# Patient Record
Sex: Female | Born: 1960 | Race: White | Hispanic: No | Marital: Single | Smoking: Never smoker
Health system: Southern US, Community
[De-identification: ages and names within clinical notes are randomized; demographics above are authoritative.]

## PROBLEM LIST (undated history)

## (undated) DIAGNOSIS — D649 Anemia, unspecified: Secondary | ICD-10-CM

## (undated) DIAGNOSIS — T7840XA Allergy, unspecified, initial encounter: Secondary | ICD-10-CM

## (undated) HISTORY — PX: WISDOM TOOTH EXTRACTION: SHX21

## (undated) HISTORY — PX: COLONOSCOPY: SHX174

## (undated) HISTORY — PX: APPENDECTOMY: SHX54

## (undated) HISTORY — DX: Allergy, unspecified, initial encounter: T78.40XA

## (undated) HISTORY — DX: Anemia, unspecified: D64.9

---

## 2002-03-12 ENCOUNTER — Other Ambulatory Visit: Admission: RE | Admit: 2002-03-12 | Discharge: 2002-03-12 | Payer: Self-pay | Admitting: Obstetrics and Gynecology

## 2003-04-20 ENCOUNTER — Other Ambulatory Visit: Admission: RE | Admit: 2003-04-20 | Discharge: 2003-04-20 | Payer: Self-pay | Admitting: Obstetrics and Gynecology

## 2003-05-03 ENCOUNTER — Ambulatory Visit (HOSPITAL_COMMUNITY): Admission: RE | Admit: 2003-05-03 | Discharge: 2003-05-03 | Payer: Self-pay | Admitting: Obstetrics and Gynecology

## 2003-05-23 ENCOUNTER — Encounter (HOSPITAL_COMMUNITY): Admission: RE | Admit: 2003-05-23 | Discharge: 2003-08-21 | Payer: Self-pay | Admitting: Obstetrics and Gynecology

## 2004-04-27 ENCOUNTER — Other Ambulatory Visit: Admission: RE | Admit: 2004-04-27 | Discharge: 2004-04-27 | Payer: Self-pay | Admitting: Obstetrics and Gynecology

## 2004-05-28 ENCOUNTER — Ambulatory Visit: Payer: Self-pay | Admitting: Internal Medicine

## 2004-05-30 ENCOUNTER — Ambulatory Visit (HOSPITAL_COMMUNITY): Admission: RE | Admit: 2004-05-30 | Discharge: 2004-05-30 | Payer: Self-pay | Admitting: Obstetrics and Gynecology

## 2004-06-08 ENCOUNTER — Ambulatory Visit: Payer: Self-pay | Admitting: Internal Medicine

## 2004-06-08 ENCOUNTER — Ambulatory Visit (HOSPITAL_COMMUNITY): Admission: RE | Admit: 2004-06-08 | Discharge: 2004-06-08 | Payer: Self-pay | Admitting: Internal Medicine

## 2004-08-22 ENCOUNTER — Ambulatory Visit: Payer: Self-pay | Admitting: Internal Medicine

## 2004-08-28 ENCOUNTER — Ambulatory Visit: Payer: Self-pay | Admitting: Internal Medicine

## 2005-05-21 ENCOUNTER — Other Ambulatory Visit: Admission: RE | Admit: 2005-05-21 | Discharge: 2005-05-21 | Payer: Self-pay | Admitting: Obstetrics and Gynecology

## 2005-07-31 ENCOUNTER — Ambulatory Visit (HOSPITAL_COMMUNITY): Admission: RE | Admit: 2005-07-31 | Discharge: 2005-07-31 | Payer: Self-pay | Admitting: Obstetrics and Gynecology

## 2005-08-29 ENCOUNTER — Encounter: Admission: RE | Admit: 2005-08-29 | Discharge: 2005-08-29 | Payer: Self-pay | Admitting: Obstetrics and Gynecology

## 2005-09-11 ENCOUNTER — Encounter: Admission: RE | Admit: 2005-09-11 | Discharge: 2005-09-11 | Payer: Self-pay | Admitting: Obstetrics and Gynecology

## 2006-03-21 ENCOUNTER — Ambulatory Visit: Payer: Self-pay | Admitting: Internal Medicine

## 2006-09-16 ENCOUNTER — Ambulatory Visit (HOSPITAL_COMMUNITY): Admission: RE | Admit: 2006-09-16 | Discharge: 2006-09-16 | Payer: Self-pay | Admitting: Obstetrics and Gynecology

## 2006-09-18 ENCOUNTER — Encounter: Admission: RE | Admit: 2006-09-18 | Discharge: 2006-09-18 | Payer: Self-pay | Admitting: Obstetrics and Gynecology

## 2007-06-18 ENCOUNTER — Ambulatory Visit: Payer: Self-pay | Admitting: Internal Medicine

## 2007-06-18 DIAGNOSIS — G43009 Migraine without aura, not intractable, without status migrainosus: Secondary | ICD-10-CM | POA: Insufficient documentation

## 2007-06-18 DIAGNOSIS — E876 Hypokalemia: Secondary | ICD-10-CM | POA: Insufficient documentation

## 2007-09-28 ENCOUNTER — Ambulatory Visit (HOSPITAL_COMMUNITY): Admission: RE | Admit: 2007-09-28 | Discharge: 2007-09-28 | Payer: Self-pay | Admitting: Obstetrics and Gynecology

## 2007-10-22 ENCOUNTER — Ambulatory Visit: Payer: Self-pay | Admitting: Internal Medicine

## 2007-10-27 ENCOUNTER — Encounter: Payer: Self-pay | Admitting: Internal Medicine

## 2007-10-29 ENCOUNTER — Encounter (INDEPENDENT_AMBULATORY_CARE_PROVIDER_SITE_OTHER): Payer: Self-pay | Admitting: *Deleted

## 2008-03-07 ENCOUNTER — Encounter (INDEPENDENT_AMBULATORY_CARE_PROVIDER_SITE_OTHER): Payer: Self-pay | Admitting: *Deleted

## 2008-10-03 ENCOUNTER — Ambulatory Visit (HOSPITAL_COMMUNITY): Admission: RE | Admit: 2008-10-03 | Discharge: 2008-10-03 | Payer: Self-pay | Admitting: Obstetrics and Gynecology

## 2009-01-25 ENCOUNTER — Ambulatory Visit: Payer: Self-pay | Admitting: Internal Medicine

## 2009-07-31 ENCOUNTER — Ambulatory Visit: Payer: Self-pay | Admitting: Internal Medicine

## 2009-07-31 DIAGNOSIS — J309 Allergic rhinitis, unspecified: Secondary | ICD-10-CM | POA: Insufficient documentation

## 2009-09-08 DIAGNOSIS — D649 Anemia, unspecified: Secondary | ICD-10-CM

## 2009-09-08 HISTORY — DX: Anemia, unspecified: D64.9

## 2009-10-02 IMAGING — MG MM DIGITAL SCREENING BILAT
5 series · 5 of 5 positions shown · non-contrast
Comparison: none

DG SCREEN MAMMOGRAM BILATERAL
Bilateral CC and MLO view(s) were taken.

DIGITAL SCREENING MAMMOGRAM WITH CAD:
The breast tissue is extremely dense.  No masses or malignant type calcifications are identified.  
Compared with prior studies.

[R CC]
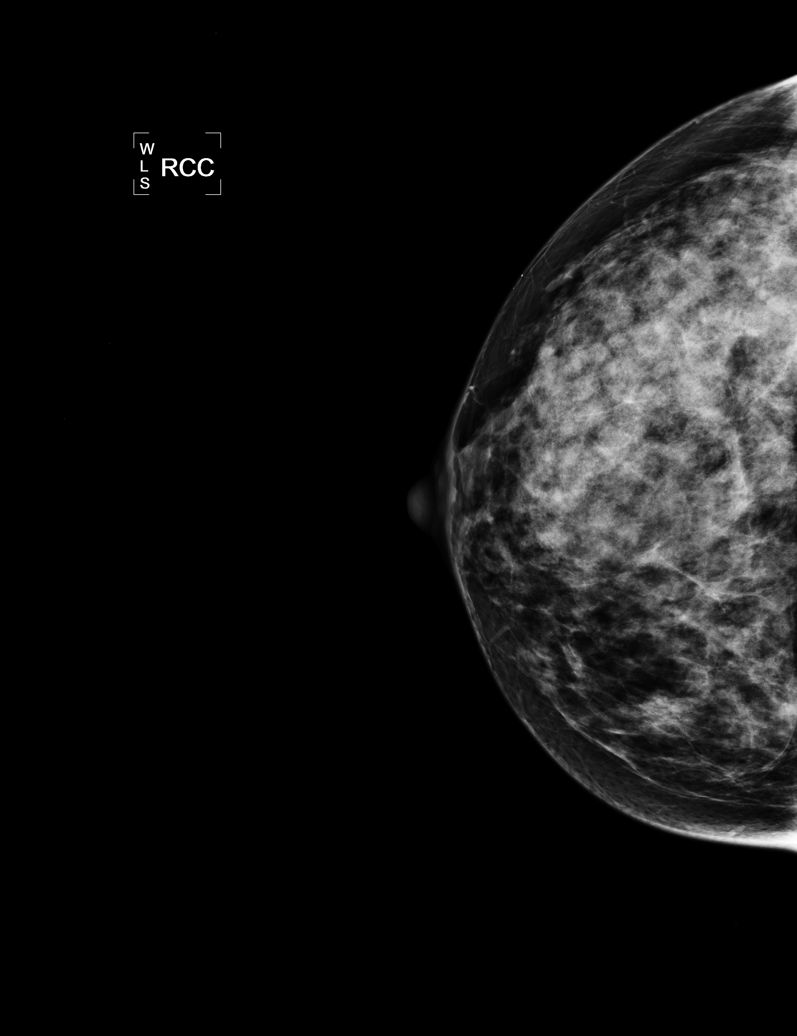

[R MLO]
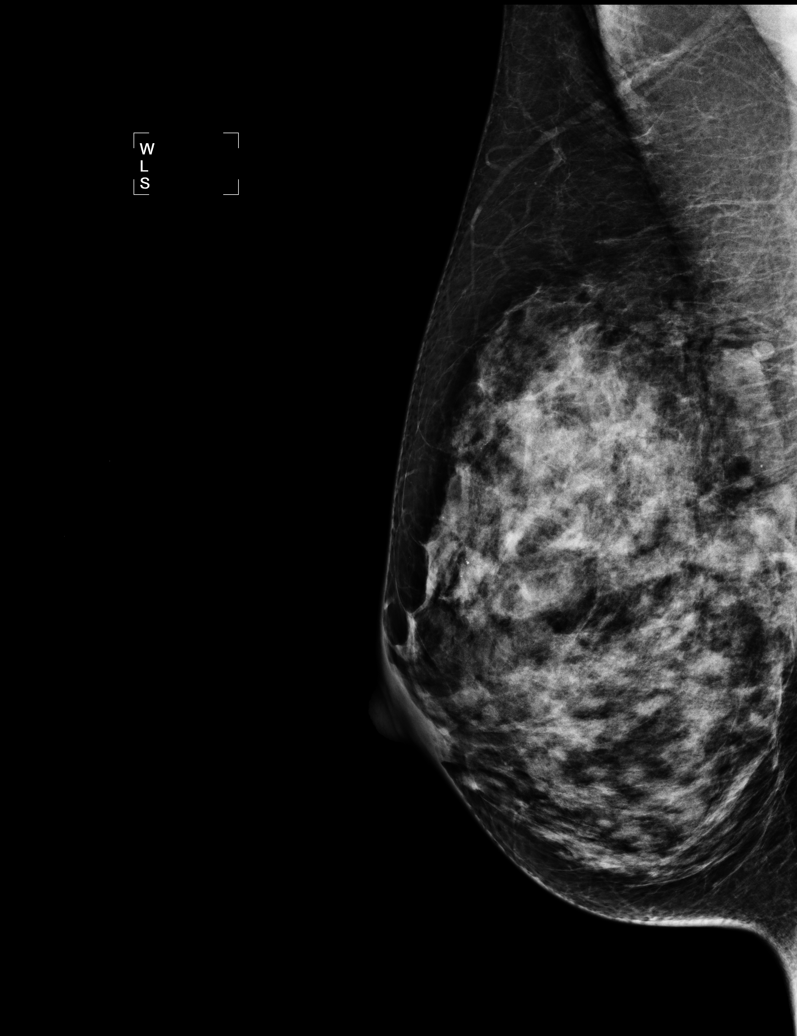

[L CC (1 of 2)]
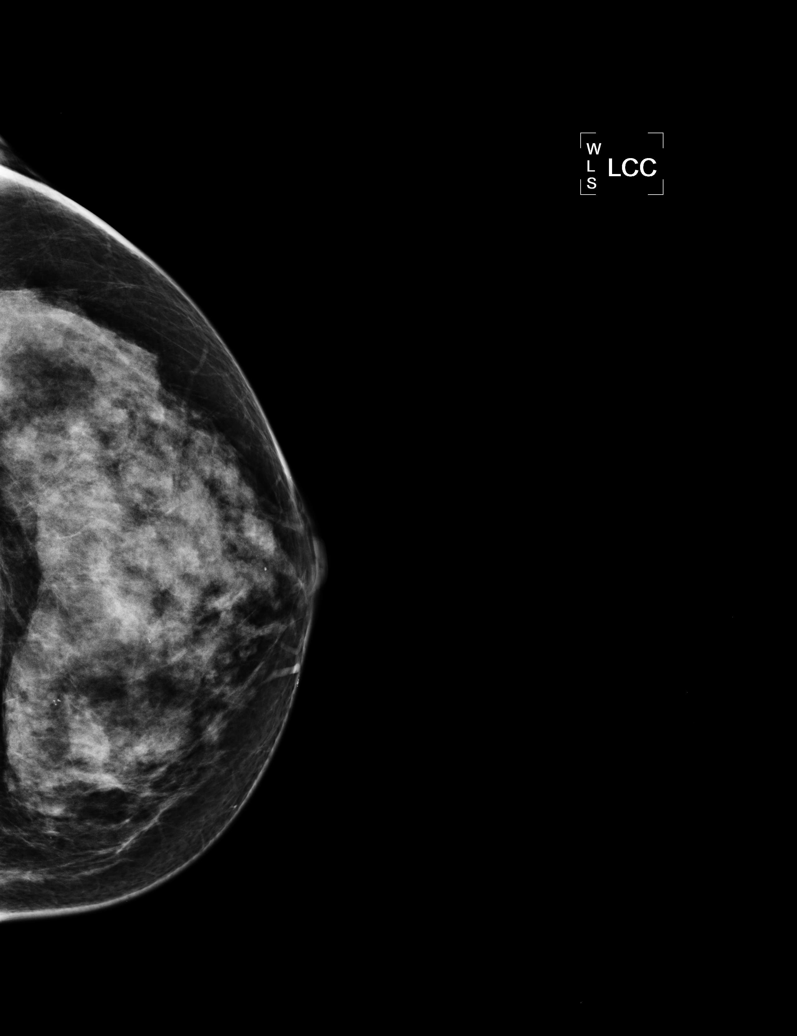

[L MLO]
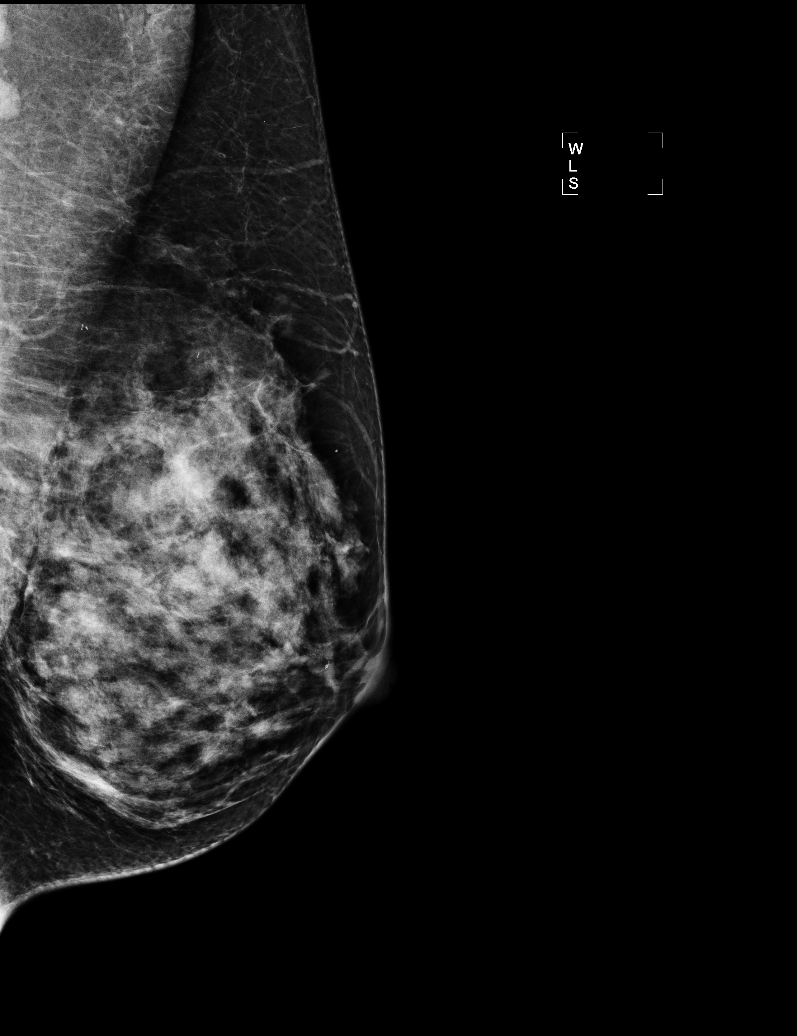

[L CC (2 of 2)]
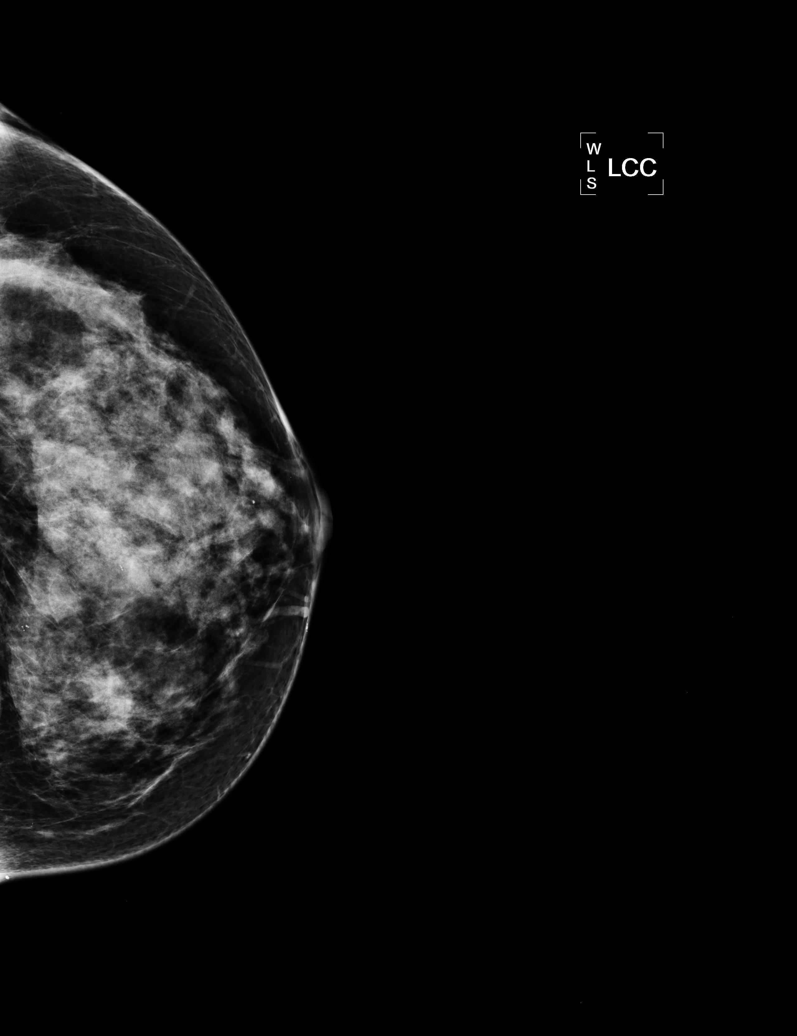

[5 of 5 positions shown; findings below may reference images not displayed]

IMPRESSION: No specific mammographic evidence of malignancy.  Next screening mammogram is recommended in one 
year.

ASSESSMENT: Negative - BI-RADS 1

Screening mammogram in 1 year.
ANALYZED BY COMPUTER AIDED DETECTION. , THIS PROCEDURE WAS A DIGITAL MAMMOGRAM.

## 2009-10-04 ENCOUNTER — Ambulatory Visit: Payer: Self-pay | Admitting: Internal Medicine

## 2009-10-06 ENCOUNTER — Ambulatory Visit (HOSPITAL_COMMUNITY): Admission: RE | Admit: 2009-10-06 | Discharge: 2009-10-06 | Payer: Self-pay | Admitting: Obstetrics and Gynecology

## 2009-10-09 LAB — CONVERTED CEMR LAB
ALT: 12 units/L (ref 0–35)
AST: 16 units/L (ref 0–37)
Albumin: 3.6 g/dL (ref 3.5–5.2)
Alkaline Phosphatase: 37 units/L — ABNORMAL LOW (ref 39–117)
BUN: 11 mg/dL (ref 6–23)
Basophils Absolute: 0 10*3/uL (ref 0.0–0.1)
Basophils Relative: 0.7 % (ref 0.0–3.0)
Bilirubin, Direct: 0 mg/dL (ref 0.0–0.3)
CO2: 28 meq/L (ref 19–32)
Calcium: 8.6 mg/dL (ref 8.4–10.5)
Chloride: 105 meq/L (ref 96–112)
Cholesterol: 153 mg/dL (ref 0–200)
Creatinine, Ser: 0.8 mg/dL (ref 0.4–1.2)
Eosinophils Absolute: 0.1 10*3/uL (ref 0.0–0.7)
Eosinophils Relative: 2.6 % (ref 0.0–5.0)
GFR calc non Af Amer: 81.13 mL/min (ref >60)
Glucose, Bld: 92 mg/dL (ref 70–99)
HCT: 35.4 % — ABNORMAL LOW (ref 36.0–46.0)
HDL: 55.1 mg/dL (ref >39.00)
Hemoglobin: 11.9 g/dL — ABNORMAL LOW (ref 12.0–15.0)
LDL Cholesterol: 87 mg/dL (ref 0–99)
Lymphocytes Relative: 34 % (ref 12.0–46.0)
Lymphs Abs: 1.8 10*3/uL (ref 0.7–4.0)
MCHC: 33.8 g/dL (ref 30.0–36.0)
MCV: 91.9 fL (ref 78.0–100.0)
Monocytes Absolute: 0.3 10*3/uL (ref 0.1–1.0)
Monocytes Relative: 6.3 % (ref 3.0–12.0)
Neutro Abs: 3 10*3/uL (ref 1.4–7.7)
Neutrophils Relative %: 56.4 % (ref 43.0–77.0)
Platelets: 237 10*3/uL (ref 150.0–400.0)
Potassium: 3.6 meq/L (ref 3.5–5.1)
RBC: 3.85 M/uL — ABNORMAL LOW (ref 3.87–5.11)
RDW: 14.1 % (ref 11.5–14.6)
Sodium: 139 meq/L (ref 135–145)
TSH: 1.43 microintl units/mL (ref 0.35–5.50)
Total Bilirubin: 1 mg/dL (ref 0.3–1.2)
Total CHOL/HDL Ratio: 3
Total Protein: 6.9 g/dL (ref 6.0–8.3)
Triglycerides: 55 mg/dL (ref 0.0–149.0)
VLDL: 11 mg/dL (ref 0.0–40.0)
WBC: 5.3 10*3/uL (ref 4.5–10.5)

## 2010-07-01 ENCOUNTER — Encounter: Payer: Self-pay | Admitting: Obstetrics and Gynecology

## 2010-07-08 LAB — CONVERTED CEMR LAB
ALT: 18 units/L (ref 0–35)
AST: 20 units/L (ref 0–37)
Albumin: 4.4 g/dL (ref 3.5–5.2)
Alkaline Phosphatase: 48 units/L (ref 39–117)
BUN: 9 mg/dL (ref 6–23)
Basophils Absolute: 0.1 10*3/uL (ref 0.0–0.1)
Basophils Relative: 0.8 % (ref 0.0–1.0)
Bilirubin, Direct: 0.1 mg/dL (ref 0.0–0.3)
CO2: 28 meq/L (ref 19–32)
Calcium: 9.7 mg/dL (ref 8.4–10.5)
Chloride: 102 meq/L (ref 96–112)
Cholesterol: 156 mg/dL (ref 0–200)
Creatinine, Ser: 0.7 mg/dL (ref 0.4–1.2)
Eosinophils Absolute: 0.1 10*3/uL (ref 0.0–0.7)
Eosinophils Relative: 2.2 % (ref 0.0–5.0)
GFR calc Af Amer: 116 mL/min
GFR calc non Af Amer: 96 mL/min
Glucose, Bld: 94 mg/dL (ref 70–99)
HCT: 41.1 % (ref 36.0–46.0)
HDL: 43.2 mg/dL (ref >39.0)
Hemoglobin: 13.9 g/dL (ref 12.0–15.0)
LDL Cholesterol: 99 mg/dL (ref 0–99)
Lymphocytes Relative: 38.3 % (ref 12.0–46.0)
MCHC: 33.8 g/dL (ref 30.0–36.0)
MCV: 94.8 fL (ref 78.0–100.0)
Monocytes Absolute: 0.3 10*3/uL (ref 0.1–1.0)
Monocytes Relative: 5.4 % (ref 3.0–12.0)
Neutro Abs: 3.4 10*3/uL (ref 1.4–7.7)
Neutrophils Relative %: 53.3 % (ref 43.0–77.0)
Platelets: 277 10*3/uL (ref 150–400)
Potassium: 5.3 meq/L — ABNORMAL HIGH (ref 3.5–5.1)
RBC: 4.34 M/uL (ref 3.87–5.11)
RDW: 12.6 % (ref 11.5–14.6)
Sodium: 139 meq/L (ref 135–145)
TSH: 0.72 microintl units/mL (ref 0.35–5.50)
Total Bilirubin: 1.5 mg/dL — ABNORMAL HIGH (ref 0.3–1.2)
Total CHOL/HDL Ratio: 3.6
Total Protein: 8 g/dL (ref 6.0–8.3)
Triglycerides: 70 mg/dL (ref 0–149)
VLDL: 14 mg/dL (ref 0–40)
WBC: 6.4 10*3/uL (ref 4.5–10.5)

## 2010-07-10 NOTE — Assessment & Plan Note (Signed)
Summary: CPX//PH   Vital Signs:  Patient profile:   50 year old female Height:      68 inches Weight:      135.4 pounds BMI:     20.66 Temp:     97.7 degrees F oral Pulse rate:   68 / minute Resp:     14 per minute BP sitting:   100 / 62  (left arm) Cuff size:   regular  Vitals Entered By: Shonna Chock (July 31, 2009 8:40 AM)  Comments REVIEWED MED LIST, PATIENT AGREED DOSE AND INSTRUCTION CORRECT    History of Present Illness: Diana Bradford is here for a physical; she is asymptomatic.  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         Migraines are controlled; aura of colored "swirls".No prodrome to migraines. No bloating ; [place] of pelvis done annually  Physical Exam  General:  Thin but well-nourished; alert,appropriate and cooperative throughout examination Head:   Normocephalic and atraumatic without obvious abnormalities.  Eyes:  No corneal or conjunctival inflammation noted.  Perrla. Funduscopic exam benign, without hemorrhages, exudates or papilledema. Ears:  External ear exam shows no significant lesions or deformities.  Otoscopic examination reveals clear canals, tympanic membranes are intact bilaterally without bulging, retraction, inflammation or discharge. Hearing is grossly normal bilaterally. Nose:  External nasal examination shows no deformity or inflammation. Nasal mucosa are pink and moist without lesions or exudates. Mouth:  Oral mucosa and oropharynx without lesions or exudates.  Teeth in good repair. Neck:  No deformities, masses, or tenderness noted. Lungs:  Normal respiratory effort, chest expands symmetrically. Lungs are clear to auscultation, no crackles or wheezes. Heart:  Normal rate and regular rhythm. S1 and S2 normal without gallop, murmur, click, rub . S4 with slight bruit Abdomen:  Bowel sounds positive,abdomen soft and non-tender without masses, organomegaly or hernias noted.Aortic faint bruit w/o AAA Genitalia:  Dr Senaida Ores Msk:  No deformity or scoliosis noted of thoracic or lumbar spine.   Pulses:  R and L carotid,radial,dorsalis pedis and posterior tibial pulses are full and equal bilaterally Extremities:  No clubbing, cyanosis, edema. Minor OA DIP changes  noted with normal full range of motion of all joints. Minor crepitus of knees  Neurologic:  alert & oriented X3 and DTRs symmetrical and normal.   Skin:  Intact without suspicious lesions or rashes Cervical Nodes:  No lymphadenopathy noted Axillary Nodes:  No palpable lymphadenopathy Psych:  memory intact for recent and remote, normally interactive, and good eye contact.     Impression & Recommendations:  Problem # 1:  PREVENTIVE HEALTH CARE (ICD-V70.0)  Orders: EKG w/ Interpretation (93000)  Problem # 2:  MIGRAINE, COMMON (ICD-346.10) Stable Her updated  medication list for this problem includes:    Sumatriptan Succinate 100 Mg Tabs (Sumatriptan succinate) .Marland Kitchen... 1 as needed  Problem # 3:  FAMILY HISTORY OF MALIGNANT NEOPLASM OF BREAST (ICD-V16.3) M & 3 M aunts with breast CA; also 1 M aunt ovarian CA  Complete Medication List: 1)  Nasonex 50 Mcg/act Susp (Mometasone furoate) .Marland Kitchen.. 1 spray two times a day 2)  Sumatriptan Succinate 100 Mg Tabs (Sumatriptan succinate) .Marland Kitchen.. 1 as needed  Patient Instructions: 1)  Consider fasting labs: 2)  BMP ;Hepatic Panel ;Lipid Panel ;TSH ;CBC w/ Diff.Please consider participating in the  Genetic Evaluation Study available @ this site. Prescriptions: SUMATRIPTAN SUCCINATE 100 MG TABS (SUMATRIPTAN SUCCINATE) 1 as needed  #9 x 6   Entered and Authorized by:   Marga Melnick MD   Signed by:   Marga Melnick MD on 07/31/2009   Method used:   Print then Give to Patient   RxID:   [id number]

## 2010-10-03 ENCOUNTER — Other Ambulatory Visit (HOSPITAL_COMMUNITY): Payer: Self-pay | Admitting: Obstetrics and Gynecology

## 2010-10-03 DIAGNOSIS — Z1231 Encounter for screening mammogram for malignant neoplasm of breast: Secondary | ICD-10-CM

## 2010-10-08 ENCOUNTER — Other Ambulatory Visit: Payer: Self-pay

## 2010-10-08 MED ORDER — SUMATRIPTAN SUCCINATE 100 MG PO TABS: 100.0000 mg | ORAL_TABLET | ORAL | Status: DC | PRN

## 2010-10-13 ENCOUNTER — Encounter: Payer: Self-pay | Admitting: Internal Medicine

## 2010-10-16 ENCOUNTER — Ambulatory Visit (INDEPENDENT_AMBULATORY_CARE_PROVIDER_SITE_OTHER): Payer: BC Managed Care – PPO | Admitting: Internal Medicine

## 2010-10-16 ENCOUNTER — Encounter: Payer: Self-pay | Admitting: Internal Medicine

## 2010-10-16 VITALS — BP 102/70 | HR 72 | Temp 97.9°F | Wt 141.6 lb

## 2010-10-16 DIAGNOSIS — IMO0002 Reserved for concepts with insufficient information to code with codable children: Secondary | ICD-10-CM

## 2010-10-16 DIAGNOSIS — M1990 Unspecified osteoarthritis, unspecified site: Secondary | ICD-10-CM

## 2010-10-16 DIAGNOSIS — M5414 Radiculopathy, thoracic region: Secondary | ICD-10-CM

## 2010-10-16 DIAGNOSIS — M25559 Pain in unspecified hip: Secondary | ICD-10-CM

## 2010-10-16 MED ORDER — TRAMADOL HCL 50 MG PO TABS: 50.0000 mg | ORAL_TABLET | Freq: Four times a day (QID) | ORAL | Status: AC | PRN

## 2010-10-16 NOTE — Patient Instructions (Signed)
Please  use Arthritis Strength Tylenol every 4 hours as needed or tramadol 50 mg every 6 hours as needed for significant pain. If the rib cage symptoms persist, I do recommend thoracic spine films.
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
## 2010-10-17 ENCOUNTER — Ambulatory Visit (HOSPITAL_COMMUNITY)
Admission: RE | Admit: 2010-10-17 | Discharge: 2010-10-17 | Disposition: A | Discharge status: Home / Self Care | Payer: BC Managed Care – PPO | Source: Ambulatory Visit | Attending: Obstetrics and Gynecology | Admitting: Obstetrics and Gynecology

## 2010-10-17 DIAGNOSIS — Z1231 Encounter for screening mammogram for malignant neoplasm of breast: Secondary | ICD-10-CM

## 2010-12-07 ENCOUNTER — Encounter: Payer: Self-pay | Admitting: Internal Medicine

## 2011-01-03 ENCOUNTER — Encounter: Payer: Self-pay | Admitting: Internal Medicine

## 2011-03-25 ENCOUNTER — Encounter: Payer: Self-pay | Admitting: Internal Medicine

## 2011-05-13 ENCOUNTER — Encounter: Payer: Self-pay | Admitting: Internal Medicine

## 2011-05-30 ENCOUNTER — Other Ambulatory Visit: Payer: Self-pay | Admitting: Internal Medicine

## 2011-08-12 ENCOUNTER — Encounter: Payer: Self-pay | Admitting: Internal Medicine

## 2011-08-19 ENCOUNTER — Encounter: Payer: Self-pay | Admitting: Internal Medicine

## 2011-08-19 ENCOUNTER — Ambulatory Visit (INDEPENDENT_AMBULATORY_CARE_PROVIDER_SITE_OTHER): Payer: BC Managed Care – PPO | Admitting: Internal Medicine

## 2011-08-19 VITALS — BP 112/76 | HR 67 | Temp 98.2°F | Resp 12 | Ht 68.75 in | Wt 149.0 lb

## 2011-08-19 DIAGNOSIS — Z0000 Encounter for general adult medical examination without abnormal findings: Secondary | ICD-10-CM

## 2011-08-19 DIAGNOSIS — G43009 Migraine without aura, not intractable, without status migrainosus: Secondary | ICD-10-CM

## 2011-08-19 NOTE — Patient Instructions (Signed)
Preventive Health Care: Exercise  30-45  minutes a day, 3-4 days a week. Walking is especially valuable in preventing Osteoporosis. Eat a low-fat diet with lots of fruits and vegetables, up to 7-9 servings per day. Consume less than 30 grams of sugar per day from foods & drinks with High Fructose Corn Syrup as # 1,2,3 or #4 on label. Please  schedule fasting Labs : BMET,Lipids, hepatic panel, CBC & dif, TSH.  PLEASE BRING THESE INSTRUCTIONS TO FOLLOW UP  LAB APPOINTMENT.This will guarantee correct labs are drawn, eliminating need for repeat blood sampling ( needle sticks ! ). Diagnoses /Codes: V70.0 Please complete stool cards

## 2011-08-19 NOTE — Progress Notes (Signed)
Subjective:    Patient ID: Newman Nickels, female    DOB: 1961-05-22, 51 y.o.   MRN: [id number]  HPI   Mrs. Cochrane  is here for a physical;acute issues include recent R hand fracture after tripping & falling. This is  being treated by Dr Mina Marble.      Review of Systems Patient reports no significant  vision/ hearing  changes, adenopathy,fever,  persistant / recurrent hoarseness , swallowing issues, chest pain,palpitations,edema,persistant /recurrent cough, hemoptysis, dyspnea( rest/ exertional/paroxysmal nocturnal), gastrointestinal bleeding(melena, rectal bleeding), abdominal pain, significant heartburn,  bowel changes,GU symptoms(dysuria, hematuria,pyuria, incontinence), Gyn symptoms(abnormal  bleeding ,bloating, pain),  syncope, focal weakness, memory loss,numbness & tingling, skin/hair /nail changes,abnormal bruising or bleeding, anxiety,or depression. Weight up 10 # over past 12 months.     Objective:   Physical Exam Gen.: Thin but healthy and well-nourished in appearance. Alert, appropriate and cooperative throughout exam. Head: Normocephalic without obvious abnormalities  Eyes: No corneal or conjunctival inflammation noted. Pupils equal round reactive to light and accommodation. Fundal exam is benign without hemorrhages, exudate, papilledema. Extraocular motion intact. Vision grossly normal. Ears: External  ear exam reveals no significant lesions or deformities. Canals clear .TMs normal. Hearing is grossly normal bilaterally. Nose: External nasal exam reveals no deformity or inflammation. Nasal mucosa are pink and moist. No lesions or exudates noted.   Mouth: Oral mucosa and oropharynx reveal no lesions or exudates. Teeth in good repair. Neck: No deformities, masses, or tenderness noted. Range of motion & Thyroid normal Lungs: Normal respiratory effort; chest expands symmetrically. Lungs are clear to auscultation without rales, wheezes, or increased work of breathing. Heart: Normal  rate and rhythm. Normal S1 and S2. No gallop, click, or rub. S 4 w/o  murmur. Abdomen: Bowel sounds normal; abdomen soft and nontender. No masses, organomegaly or hernias noted. Genitalia: Dr Senaida Ores .                                                                                   Musculoskeletal/extremities: No deformity or scoliosis noted of  the thoracic or lumbar spine. No clubbing, cyanosis, edema noted. Range of motion  normal .Tone & strength  normal.Joints : DIP OA changes. Nail health  good. Vascular: Carotid, radial artery, dorsalis pedis and  posterior tibial pulses are full and equal. No bruits present. Neurologic: Alert and oriented x3. Deep tendon reflexes symmetrical and normal.          Skin: Intact without suspicious lesions or rashes. Lymph: No cervical, axillary lymphadenopathy present. Psych: Mood and affect are normal. Normally interactive                                                                                         Assessment & Plan:  #1 comprehensive physical exam; no acute findings #2 see Problem List with Assessments & Recommendations Plan:  see Orders

## 2011-09-16 ENCOUNTER — Other Ambulatory Visit (HOSPITAL_COMMUNITY): Payer: Self-pay | Admitting: Obstetrics and Gynecology

## 2011-09-16 DIAGNOSIS — Z1231 Encounter for screening mammogram for malignant neoplasm of breast: Secondary | ICD-10-CM

## 2011-11-05 ENCOUNTER — Ambulatory Visit (HOSPITAL_COMMUNITY): Payer: BC Managed Care – PPO

## 2011-11-06 ENCOUNTER — Ambulatory Visit (HOSPITAL_COMMUNITY)
Admission: RE | Admit: 2011-11-06 | Discharge: 2011-11-06 | Disposition: A | Discharge status: Home / Self Care | Payer: BC Managed Care – PPO | Source: Ambulatory Visit | Attending: Obstetrics and Gynecology | Admitting: Obstetrics and Gynecology

## 2011-11-06 DIAGNOSIS — Z1231 Encounter for screening mammogram for malignant neoplasm of breast: Secondary | ICD-10-CM | POA: Insufficient documentation

## 2011-12-02 ENCOUNTER — Encounter: Payer: Self-pay | Admitting: Internal Medicine

## 2011-12-02 ENCOUNTER — Ambulatory Visit (INDEPENDENT_AMBULATORY_CARE_PROVIDER_SITE_OTHER): Payer: BC Managed Care – PPO | Admitting: Internal Medicine

## 2011-12-02 VITALS — BP 110/70 | HR 76 | Temp 98.3°F | Wt 150.0 lb

## 2011-12-02 DIAGNOSIS — G43909 Migraine, unspecified, not intractable, without status migrainosus: Secondary | ICD-10-CM

## 2011-12-02 DIAGNOSIS — L259 Unspecified contact dermatitis, unspecified cause: Secondary | ICD-10-CM

## 2011-12-02 MED ORDER — HYDROXYZINE HCL 10 MG PO TABS: 10.0000 mg | ORAL_TABLET | Freq: Three times a day (TID) | ORAL | Status: AC | PRN

## 2011-12-02 MED ORDER — SUMATRIPTAN SUCCINATE 100 MG PO TABS: 100.0000 mg | ORAL_TABLET | ORAL | Status: DC | PRN

## 2011-12-02 MED ORDER — MOMETASONE FUROATE 0.1 % EX OINT: TOPICAL_OINTMENT | Freq: Two times a day (BID) | CUTANEOUS | Status: AC

## 2011-12-02 NOTE — Patient Instructions (Addendum)
Caution:  Hydroxyzine may cause significant drowsiness

## 2011-12-02 NOTE — Progress Notes (Signed)
  Subjective:    Patient ID: Diana Bradford, female    DOB: 07-Nov-1960, 51 y.o.   MRN: [id number]  HPI 4 weeks ago she noted a pruritic  rash under her ring on the fourth left digit. She states that the rate and his pressures nails and not cadmium, nickel,or zinc. There was associated swelling of all 4 fingers which delayed removing the ring for almost a week. For the first 3-5 days she applied Neosporin. Subsequently she should used cortisone, Lamisil and taking Benadryl without benefit.  Approximately week prior to the onset of the rash she been bitten by a cat on the left index finger.    Review of Systems She denies fever, chills, or sweats. She does have seasonal allergic rhinitis but has not been active during this period. She does not have history of asthma or eczema     Objective:   Physical Exam   She appears healthy and well-nourished in no distress  She has no lymphadenopathy about the neck, axilla, or epitrochlear areas  There is a slightly papular rash in a circumferential pattern at the fourth left digit        Assessment & Plan:  #1 contact dermatitis in ring  suggested clinically  Plan: See orders and recommendations

## 2012-03-10 ENCOUNTER — Telehealth: Payer: Self-pay

## 2012-03-10 NOTE — Telephone Encounter (Signed)
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 Pt at travel clinic.Advised pt that she only had hep a series, flu shot 2009. Pt stated understanding.     MW

## 2012-05-10 LAB — HM PAP SMEAR

## 2012-11-24 ENCOUNTER — Other Ambulatory Visit (HOSPITAL_COMMUNITY): Payer: Self-pay | Admitting: Obstetrics and Gynecology

## 2012-11-24 DIAGNOSIS — Z1231 Encounter for screening mammogram for malignant neoplasm of breast: Secondary | ICD-10-CM

## 2012-12-02 ENCOUNTER — Ambulatory Visit (HOSPITAL_COMMUNITY)
Admission: RE | Admit: 2012-12-02 | Discharge: 2012-12-02 | Disposition: A | Discharge status: Home / Self Care | Payer: BC Managed Care – PPO | Source: Ambulatory Visit | Attending: Obstetrics and Gynecology | Admitting: Obstetrics and Gynecology

## 2012-12-02 DIAGNOSIS — Z1231 Encounter for screening mammogram for malignant neoplasm of breast: Secondary | ICD-10-CM | POA: Insufficient documentation

## 2012-12-09 ENCOUNTER — Other Ambulatory Visit: Payer: Self-pay

## 2012-12-09 DIAGNOSIS — G43909 Migraine, unspecified, not intractable, without status migrainosus: Secondary | ICD-10-CM

## 2012-12-09 MED ORDER — SUMATRIPTAN SUCCINATE 100 MG PO TABS: ORAL_TABLET | ORAL | Status: DC

## 2012-12-09 NOTE — Telephone Encounter (Signed)
Patient will need to schedule a CPX prior to next refill

## 2013-03-09 ENCOUNTER — Telehealth: Payer: Self-pay | Admitting: Internal Medicine

## 2013-03-09 NOTE — Telephone Encounter (Signed)
Patient would like to come in for a whooping cough vaccine. Has a new grandchild and the mother wants for everyone to have this before seeing baby. Please advise if okay.

## 2013-04-21 ENCOUNTER — Telehealth: Payer: Self-pay | Admitting: Internal Medicine

## 2013-04-21 NOTE — Telephone Encounter (Signed)
Patient is calling to request coming in for a whooping cough vaccine. Please advise okay.

## 2013-04-21 NOTE — Telephone Encounter (Signed)
TDAP vaccine is ok to have. Called and and left message for patient to please call back and schedule a nurse visit to get this.

## 2013-04-26 ENCOUNTER — Ambulatory Visit (INDEPENDENT_AMBULATORY_CARE_PROVIDER_SITE_OTHER): Payer: BC Managed Care – PPO | Admitting: *Deleted

## 2013-04-26 DIAGNOSIS — Z23 Encounter for immunization: Secondary | ICD-10-CM

## 2013-05-04 ENCOUNTER — Encounter: Payer: BC Managed Care – PPO | Admitting: Internal Medicine

## 2013-05-20 ENCOUNTER — Encounter: Payer: Self-pay | Admitting: Internal Medicine

## 2013-06-21 ENCOUNTER — Telehealth: Payer: Self-pay

## 2013-06-21 NOTE — Telephone Encounter (Signed)
Medication and allergies:  Reviewed and updated  90 day supply/mail order: na Local pharmacy: [facility] [address]   Immunizations due:  UTD  A/P:   No changes to FH or  PSH  Or personal hx Pap--Dr Senaida Oresichardson [facility]G--11/2012--neg CCS--scheduled 07/2013--Dr Leone PayorGessner Flu vaccine--02/2013 Tdap--04/2013  To Discuss with Provider: Not at this time

## 2013-06-22 ENCOUNTER — Ambulatory Visit (INDEPENDENT_AMBULATORY_CARE_PROVIDER_SITE_OTHER): Payer: BC Managed Care – PPO | Admitting: Internal Medicine

## 2013-06-22 ENCOUNTER — Encounter: Payer: Self-pay | Admitting: Internal Medicine

## 2013-06-22 VITALS — BP 117/79 | HR 72 | Temp 98.4°F | Ht 68.25 in | Wt 158.6 lb

## 2013-06-22 DIAGNOSIS — G43009 Migraine without aura, not intractable, without status migrainosus: Secondary | ICD-10-CM

## 2013-06-22 DIAGNOSIS — Z0000 Encounter for general adult medical examination without abnormal findings: Secondary | ICD-10-CM

## 2013-06-22 DIAGNOSIS — J309 Allergic rhinitis, unspecified: Secondary | ICD-10-CM

## 2013-06-22 DIAGNOSIS — M1990 Unspecified osteoarthritis, unspecified site: Secondary | ICD-10-CM

## 2013-06-22 LAB — BASIC METABOLIC PANEL
BUN: 13 mg/dL (ref 6–23)
CHLORIDE: 103 meq/L (ref 96–112)
CO2: 27 meq/L (ref 19–32)
Calcium: 9.1 mg/dL (ref 8.4–10.5)
Creatinine, Ser: 0.7 mg/dL (ref 0.4–1.2)
GFR: 91.73 mL/min (ref >60.00)
GLUCOSE: 89 mg/dL (ref 70–99)
POTASSIUM: 4 meq/L (ref 3.5–5.1)
Sodium: 137 mEq/L (ref 135–145)

## 2013-06-22 LAB — LIPID PANEL
CHOLESTEROL: 178 mg/dL (ref 0–200)
HDL: 70.4 mg/dL (ref >39.00)
LDL CALC: 93 mg/dL (ref 0–99)
TRIGLYCERIDES: 72 mg/dL (ref 0.0–149.0)
Total CHOL/HDL Ratio: 3
VLDL: 14.4 mg/dL (ref 0.0–40.0)

## 2013-06-22 LAB — CBC WITH DIFFERENTIAL/PLATELET
Basophils Absolute: 0 10*3/uL (ref 0.0–0.1)
Basophils Relative: 0.4 % (ref 0.0–3.0)
EOS PCT: 2.7 % (ref 0.0–5.0)
Eosinophils Absolute: 0.2 10*3/uL (ref 0.0–0.7)
HEMATOCRIT: 39.3 % (ref 36.0–46.0)
Hemoglobin: 13.1 g/dL (ref 12.0–15.0)
LYMPHS ABS: 2.1 10*3/uL (ref 0.7–4.0)
Lymphocytes Relative: 36 % (ref 12.0–46.0)
MCHC: 33.3 g/dL (ref 30.0–36.0)
MCV: 89.2 fl (ref 78.0–100.0)
MONO ABS: 0.3 10*3/uL (ref 0.1–1.0)
Monocytes Relative: 4.6 % (ref 3.0–12.0)
Neutro Abs: 3.3 10*3/uL (ref 1.4–7.7)
Neutrophils Relative %: 56.3 % (ref 43.0–77.0)
PLATELETS: 287 10*3/uL (ref 150.0–400.0)
RBC: 4.41 Mil/uL (ref 3.87–5.11)
RDW: 14.4 % (ref 11.5–14.6)
WBC: 5.9 10*3/uL (ref 4.5–10.5)

## 2013-06-22 LAB — HEPATIC FUNCTION PANEL
ALT: 17 U/L (ref 0–35)
AST: 22 U/L (ref 0–37)
Albumin: 4 g/dL (ref 3.5–5.2)
Alkaline Phosphatase: 49 U/L (ref 39–117)
BILIRUBIN TOTAL: 1.1 mg/dL (ref 0.3–1.2)
Bilirubin, Direct: 0.1 mg/dL (ref 0.0–0.3)
TOTAL PROTEIN: 7.9 g/dL (ref 6.0–8.3)

## 2013-06-22 LAB — TSH: TSH: 0.92 u[IU]/mL (ref 0.35–5.50)

## 2013-06-22 NOTE — Patient Instructions (Signed)
Your next office appointment will be determined based upon review of your pending labs & BMD. Those instructions will be transmitted to you through My Chart  . 

## 2013-06-22 NOTE — Progress Notes (Signed)
Pre visit review using our clinic review tool, if applicable. No additional management support is needed unless otherwise documented below in the visit note. 

## 2013-06-22 NOTE — Progress Notes (Signed)
   Subjective:    Patient ID: Diana Bradford, female    DOB: 12/10/1960, 53 y.o.   MRN: [id number]  HPI  She is here for a physical;acute issues denied.     Review of Systems  She is asymptomatic. Her problem list was updated. She has completed BRCA  testing which was negative; this was done because of her strong family history of breast cancer.  A modified heart healthy diet is followed; exercise encompasses 60 minutes 3  times per week as yoga & running without symptoms. Specifically denied are  chest pain, palpitations, dyspnea, or claudication.       Objective:   Physical Exam  Gen.: Thin but healthy and well-nourished in appearance. Alert, appropriate and cooperative throughout exam.Appears younger than stated age  Head: Normocephalic without obvious abnormalities  Eyes: No corneal or conjunctival inflammation noted. Pupils equal round reactive to light and accommodation. Extraocular motion intact.  Ears: External  ear exam reveals no significant lesions or deformities. Canals clear .TMs normal. Hearing is grossly normal bilaterally. Nose: External nasal exam reveals no deformity or inflammation. Nasal mucosa are pink and moist. No lesions or exudates noted.   Mouth: Oral mucosa and oropharynx reveal no lesions or exudates. Teeth in good repair. Neck: No deformities, masses, or tenderness noted. Range of motion & Thyroid normal. Lungs: Normal respiratory effort; chest expands symmetrically. Lungs are clear to auscultation without rales, wheezes, or increased work of breathing. Heart: Normal rate and rhythm. Normal S1 and S2. No gallop, click, or rub. S4 w/o murmur. Abdomen: Bowel sounds normal; abdomen soft and nontender. No masses, organomegaly or hernias noted.Aorta palpable ; no AAA Genitalia:  as per Gyn                                  Musculoskeletal/extremities: No deformity or scoliosis noted of  the thoracic or lumbar spine.   No clubbing, cyanosis, or edema  deformity  noted. Range of motion normal .Tone & strength normal. Hand joints  reveal significant  DJD DIP changes. Fingernail  health good.Mild knee crepitus, L > R. Able to lie down & sit up w/o help. Negative SLR bilaterally Vascular: Carotid, radial artery, dorsalis pedis and  posterior tibial pulses are full and equal. No bruits present. Neurologic: Alert and oriented x3. Deep tendon reflexes symmetrical and normal.        Skin: Intact without suspicious lesions or rashes. Lymph: No cervical, axillary lymphadenopathy present. Psych: Mood and affect are normal. Normally interactive                                                                                        Assessment & Plan:  #1 comprehensive physical exam; no acute findings  Plan: see Orders  & Recommendations

## 2013-06-23 ENCOUNTER — Encounter: Payer: Self-pay | Admitting: *Deleted

## 2013-06-25 ENCOUNTER — Encounter: Payer: Self-pay | Admitting: *Deleted

## 2013-06-28 ENCOUNTER — Ambulatory Visit (AMBULATORY_SURGERY_CENTER): Payer: BC Managed Care – PPO

## 2013-06-28 VITALS — Ht 68.0 in | Wt 152.0 lb

## 2013-06-28 DIAGNOSIS — Z1211 Encounter for screening for malignant neoplasm of colon: Secondary | ICD-10-CM

## 2013-06-28 MED ORDER — SUPREP BOWEL PREP KIT 17.5-3.13-1.6 GM/177ML PO SOLN: 1.0000 | Freq: Once | ORAL | Status: DC

## 2013-06-29 ENCOUNTER — Encounter: Payer: Self-pay | Admitting: Internal Medicine

## 2013-07-02 ENCOUNTER — Ambulatory Visit (INDEPENDENT_AMBULATORY_CARE_PROVIDER_SITE_OTHER)
Admission: RE | Admit: 2013-07-02 | Discharge: 2013-07-02 | Disposition: A | Discharge status: Home / Self Care | Payer: BC Managed Care – PPO | Source: Ambulatory Visit | Attending: Internal Medicine | Admitting: Internal Medicine

## 2013-07-02 DIAGNOSIS — Z0000 Encounter for general adult medical examination without abnormal findings: Secondary | ICD-10-CM

## 2013-07-12 ENCOUNTER — Encounter: Payer: Self-pay | Admitting: Internal Medicine

## 2013-07-12 ENCOUNTER — Ambulatory Visit (AMBULATORY_SURGERY_CENTER): Payer: BC Managed Care – PPO | Admitting: Internal Medicine

## 2013-07-12 VITALS — BP 132/84 | HR 69 | Temp 95.3°F | Resp 10 | Ht 68.0 in | Wt 152.0 lb

## 2013-07-12 DIAGNOSIS — Z1211 Encounter for screening for malignant neoplasm of colon: Secondary | ICD-10-CM

## 2013-07-12 MED ORDER — SODIUM CHLORIDE 0.9 % IV SOLN: 500.0000 mL | INTRAVENOUS | Status: DC

## 2013-07-12 NOTE — Op Note (Signed)
[facility] [address] [place], [id number]   COLONOSCOPY PROCEDURE REPORT  PATIENT: Diana LaineRipple, [name]  MR#: [id number] BIRTHDATE: Oct 03, 1960 , 52  yrs. old GENDER: Female ENDOSCOPIST: Iva Booparl E Gabriel Conry, MD, [facility] REFERRED XB:[id number]:William Alwyn RenHopper, M.D. PROCEDURE DATE:  07/12/2013 PROCEDURE:   Colonoscopy, screening First Screening Colonoscopy - Avg.  risk and is 50 yrs.  old or older Yes.  Prior Negative Screening - Now for repeat screening. N/A  History of Adenoma - Now for follow-up colonoscopy & has been > or = to 3 yrs.  N/A  Polyps Removed Today? No.  Recommend repeat exam, <10 yrs? No. ASA CLASS:   Class II INDICATIONS:average risk screening. MEDICATIONS: propofol (Diprivan) 250mg  IV, MAC sedation, administered by CRNA, and These medications were titrated to patient response per physician's verbal order  DESCRIPTION OF PROCEDURE:   After the risks benefits and alternatives of the procedure were thoroughly explained, informed consent was obtained.  A digital rectal exam revealed no abnormalities of the rectum.   The LB [id number]  endoscope was introduced through the anus and advanced to the cecum, which was identified by both the appendix and ileocecal valve. No adverse events experienced.   The quality of the prep was Suprep good  The instrument was then slowly withdrawn as the colon was fully examined.      COLON FINDINGS: A normal appearing cecum, ileocecal valve, and appendiceal orifice were identified.  The ascending, hepatic flexure, transverse, splenic flexure, descending, sigmoid colon and rectum appeared unremarkable.  No polyps or cancers were seen.   A right colon retroflexion was performed.  Retroflexed views revealed no abnormalities. The time to cecum=2 minutes 18 seconds. Withdrawal time=7 minutes 50 seconds.  The scope was withdrawn and the procedure completed. COMPLICATIONS: There were no complications.  ENDOSCOPIC  IMPRESSION: Normal colonoscopy  RECOMMENDATIONS: Repeat colonoscopy 10 years.   eSigned:  Iva Booparl E Tishia Maestre, MD, [facility] 07/12/2013 2:23 PM   cc: Pecola LawlessWilliam F Hopper, MD and The Patient

## 2013-07-12 NOTE — Patient Instructions (Addendum)
Your colonoscopy was normal with a good prep.  Next routine colonoscopy in 10 years - 2025  I appreciate the opportunity to care for you. Iva Booparl E. Gessner, MD, FACG   YOU HAD AN ENDOSCOPIC PROCEDURE TODAY AT THE [facility]: Refer to the procedure report that was given to you for any specific questions about what was found during the examination.  If the procedure report does not answer your questions, please call your gastroenterologist to clarify.  If you requested that your care partner not be given the details of your procedure findings, then the procedure report has been included in a sealed envelope for you to review at your convenience later.  YOU SHOULD EXPECT: Some feelings of bloating in the abdomen. Passage of more gas than usual.  Walking can help get rid of the air that was put into your GI tract during the procedure and reduce the bloating. If you had a lower endoscopy (such as a colonoscopy or flexible sigmoidoscopy) you may notice spotting of blood in your stool or on the toilet paper. If you underwent a bowel prep for your procedure, then you may not have a normal bowel movement for a few days.  DIET: Your first meal following the procedure should be a light meal and then it is ok to progress to your normal diet.  A half-sandwich or bowl of soup is an example of a good first meal.  Heavy or fried foods are harder to digest and may make you feel nauseous or bloated.  Likewise meals heavy in dairy and vegetables can cause extra gas to form and this can also increase the bloating.  Drink plenty of fluids but you should avoid alcoholic beverages for 24 hours.  ACTIVITY: Your care partner should take you home directly after the procedure.  You should plan to take it easy, moving slowly for the rest of the day.  You can resume normal activity the day after the procedure however you should NOT DRIVE or use heavy machinery for 24 hours (because of the sedation medicines used during  the test).    SYMPTOMS TO REPORT IMMEDIATELY: A gastroenterologist can be reached at any hour.  During normal business hours, 8:30 AM to 5:00 PM Monday through Friday, call [phone].  After hours and on weekends, please call the GI answering service at [phone] who will take a message and have the physician on call contact you.   Following lower endoscopy (colonoscopy or flexible sigmoidoscopy):  Excessive amounts of blood in the stool  Significant tenderness or worsening of abdominal pains  Swelling of the abdomen that is new, acute  Fever of 100F or higher   FOLLOW UP: If any biopsies were taken you will be contacted by phone or by letter within the next 1-3 weeks.  Call your gastroenterologist if you have not heard about the biopsies in 3 weeks.  Our staff will call the home number listed on your records the next business day following your procedure to check on you and address any questions or concerns that you may have at that time regarding the information given to you following your procedure. This is a courtesy call and so if there is no answer at the home number and we have not heard from you through the emergency physician on call, we will assume that you have returned to your regular daily activities without incident.  SIGNATURES/CONFIDENTIALITY: You and/or your care partner have signed paperwork which will be entered into your  electronic medical record.  These signatures attest to the fact that that the information above on your After Visit Summary has been reviewed and is understood.  Full responsibility of the confidentiality of this discharge information lies with you and/or your care-partner.

## 2013-07-13 ENCOUNTER — Telehealth: Payer: Self-pay | Admitting: *Deleted

## 2013-07-13 ENCOUNTER — Encounter: Payer: Self-pay | Admitting: *Deleted

## 2013-07-13 NOTE — Telephone Encounter (Signed)
  Follow up Call-  Call back number 07/12/2013  Post procedure Call Back phone  # [phone]  Permission to leave phone message Yes     Patient questions:  Do you have a fever, pain , or abdominal swelling? no Pain Score  0 *  Have you tolerated food without any problems? yes  Have you been able to return to your normal activities? yes  Do you have any questions about your discharge instructions: Diet   no Medications  no Follow up visit  no  Do you have questions or concerns about your Care? no  Actions: * If pain score is 4 or above: No action needed, pain <4.

## 2013-09-15 ENCOUNTER — Other Ambulatory Visit: Payer: Self-pay | Admitting: Internal Medicine

## 2013-09-15 NOTE — Telephone Encounter (Signed)
Rx sent to the pharmacy by e-script.//AB/CMA 

## 2013-11-08 ENCOUNTER — Other Ambulatory Visit (HOSPITAL_COMMUNITY): Payer: Self-pay | Admitting: Obstetrics and Gynecology

## 2013-11-08 DIAGNOSIS — Z1231 Encounter for screening mammogram for malignant neoplasm of breast: Secondary | ICD-10-CM

## 2013-12-06 ENCOUNTER — Ambulatory Visit (HOSPITAL_COMMUNITY)
Admission: RE | Admit: 2013-12-06 | Discharge: 2013-12-06 | Disposition: A | Discharge status: Home / Self Care | Payer: BC Managed Care – PPO | Source: Ambulatory Visit | Attending: Obstetrics and Gynecology | Admitting: Obstetrics and Gynecology

## 2013-12-06 ENCOUNTER — Other Ambulatory Visit (HOSPITAL_COMMUNITY): Payer: Self-pay | Admitting: Obstetrics and Gynecology

## 2013-12-06 DIAGNOSIS — Z1231 Encounter for screening mammogram for malignant neoplasm of breast: Secondary | ICD-10-CM

## 2014-03-16 ENCOUNTER — Encounter: Payer: Self-pay | Admitting: Podiatry

## 2014-03-16 ENCOUNTER — Ambulatory Visit (INDEPENDENT_AMBULATORY_CARE_PROVIDER_SITE_OTHER): Payer: BC Managed Care – PPO | Admitting: Podiatry

## 2014-03-16 VITALS — BP 131/100 | HR 69 | Resp 12

## 2014-03-16 DIAGNOSIS — Q828 Other specified congenital malformations of skin: Secondary | ICD-10-CM

## 2014-03-16 NOTE — Progress Notes (Signed)
   Subjective:    Patient ID: Diana Bradford, female    DOB: [birth year], 53 y.o.   MRN: [id number]  HPI N-SORE L-LT FOOT D-6 MONTHS O-SLOWLY C-WORSE A-PRESSURE T-OTC     Review of Systems  All other systems reviewed and are negative.      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 Also, I describe self treatment including topical salicylic acid to the area.  Reappoint at patient's request

## 2014-03-17 ENCOUNTER — Encounter: Payer: Self-pay | Admitting: Podiatry

## 2014-11-14 ENCOUNTER — Other Ambulatory Visit (HOSPITAL_COMMUNITY): Payer: Self-pay | Admitting: Obstetrics and Gynecology

## 2014-11-14 DIAGNOSIS — Z1231 Encounter for screening mammogram for malignant neoplasm of breast: Secondary | ICD-10-CM

## 2014-12-13 ENCOUNTER — Ambulatory Visit (HOSPITAL_COMMUNITY)
Admission: RE | Admit: 2014-12-13 | Discharge: 2014-12-13 | Disposition: A | Discharge status: Home / Self Care | Payer: BLUE CROSS/BLUE SHIELD | Source: Ambulatory Visit | Attending: Obstetrics and Gynecology | Admitting: Obstetrics and Gynecology

## 2014-12-13 DIAGNOSIS — Z1231 Encounter for screening mammogram for malignant neoplasm of breast: Secondary | ICD-10-CM | POA: Diagnosis present

## 2015-11-21 ENCOUNTER — Other Ambulatory Visit: Payer: Self-pay | Admitting: Obstetrics and Gynecology

## 2015-11-21 DIAGNOSIS — Z1231 Encounter for screening mammogram for malignant neoplasm of breast: Secondary | ICD-10-CM

## 2016-01-15 ENCOUNTER — Ambulatory Visit: Payer: BLUE CROSS/BLUE SHIELD

## 2016-01-22 ENCOUNTER — Ambulatory Visit: Payer: BLUE CROSS/BLUE SHIELD

## 2016-02-08 ENCOUNTER — Ambulatory Visit: Payer: BLUE CROSS/BLUE SHIELD

## 2016-02-19 ENCOUNTER — Ambulatory Visit
Admission: RE | Admit: 2016-02-19 | Discharge: 2016-02-19 | Disposition: A | Discharge status: Home / Self Care | Payer: BLUE CROSS/BLUE SHIELD | Source: Ambulatory Visit | Attending: Obstetrics and Gynecology | Admitting: Obstetrics and Gynecology

## 2016-02-19 DIAGNOSIS — Z1231 Encounter for screening mammogram for malignant neoplasm of breast: Secondary | ICD-10-CM

## 2017-01-23 ENCOUNTER — Other Ambulatory Visit: Payer: Self-pay | Admitting: Obstetrics and Gynecology

## 2017-01-23 DIAGNOSIS — Z1231 Encounter for screening mammogram for malignant neoplasm of breast: Secondary | ICD-10-CM

## 2017-02-25 ENCOUNTER — Ambulatory Visit
Admission: RE | Admit: 2017-02-25 | Discharge: 2017-02-25 | Disposition: A | Discharge status: Home / Self Care | Payer: BLUE CROSS/BLUE SHIELD | Source: Ambulatory Visit | Attending: Obstetrics and Gynecology | Admitting: Obstetrics and Gynecology

## 2017-02-25 DIAGNOSIS — Z1231 Encounter for screening mammogram for malignant neoplasm of breast: Secondary | ICD-10-CM

## 2018-01-20 ENCOUNTER — Other Ambulatory Visit: Payer: Self-pay | Admitting: Obstetrics and Gynecology

## 2018-01-20 DIAGNOSIS — Z1231 Encounter for screening mammogram for malignant neoplasm of breast: Secondary | ICD-10-CM

## 2018-03-02 ENCOUNTER — Ambulatory Visit
Admission: RE | Admit: 2018-03-02 | Discharge: 2018-03-02 | Disposition: A | Discharge status: Home / Self Care | Payer: BLUE CROSS/BLUE SHIELD | Source: Ambulatory Visit | Attending: Obstetrics and Gynecology | Admitting: Obstetrics and Gynecology

## 2018-03-02 DIAGNOSIS — Z1231 Encounter for screening mammogram for malignant neoplasm of breast: Secondary | ICD-10-CM

## 2018-12-25 ENCOUNTER — Telehealth: Payer: Self-pay | Admitting: Orthopaedic Surgery

## 2018-12-25 NOTE — Telephone Encounter (Signed)
Patient called and left a vm and sstated needed appt w/[facility]. Called pat LMOM to call back to make appt

## 2018-12-29 ENCOUNTER — Ambulatory Visit: Payer: BLUE CROSS/BLUE SHIELD | Admitting: Orthopaedic Surgery

## 2019-01-04 ENCOUNTER — Ambulatory Visit (INDEPENDENT_AMBULATORY_CARE_PROVIDER_SITE_OTHER): Payer: BLUE CROSS/BLUE SHIELD | Admitting: Orthopaedic Surgery

## 2019-01-04 ENCOUNTER — Ambulatory Visit: Payer: Self-pay

## 2019-01-04 ENCOUNTER — Other Ambulatory Visit: Payer: Self-pay

## 2019-01-04 DIAGNOSIS — G8929 Other chronic pain: Secondary | ICD-10-CM | POA: Diagnosis not present

## 2019-01-04 DIAGNOSIS — M25562 Pain in left knee: Secondary | ICD-10-CM

## 2019-01-04 NOTE — Progress Notes (Signed)
Office Visit Note   Patient: Diana MewKathryn Moore Bradford           Date of Birth: Oct 25, 1960           MRN: [id number] Visit Date: 01/04/2019              Requested by: Rodrigo RanPerini, Mark, MD [address] [place] [id number] PCP: Rodrigo RanPerini, Mark, MD   Assessment & Plan: Visit Diagnoses:  1. Chronic pain of left knee     Plan: Given the locking catching in her knee an MRI is warranted to rule out a meniscal tear.  Also, would be over see the extent of the arthritis of patellofemoral joint and to see what her medial and lateral compartments look like in terms of arthritic changes.  She agrees with this treatment plan.  She will work on quad strengthening exercises in the interim and avoid hills.  We will see her back after the MRI.  All question concerns were answered and addressed.  Follow-Up Instructions: Return in about 2 weeks (around 01/18/2019).   Orders:  Orders Placed This Encounter  Procedures  . XR Knee 1-2 Views Left   No orders of the defined types were placed in this encounter.     Procedures: No procedures performed   Clinical Data: No additional findings.   Subjective: Chief Complaint  Patient presents with  . Left Knee - Pain  The patient is someone of seen in the past.  She is a very pleasant and active 58 year old female with debilitating left knee pain with locking and catching.  She has a lot of grinding with her left knee and a lot of popping as well.  It is been slowly getting worse.  She is someone with a history of osteoarthritis but not in the knee that she is aware of other than having some knee issues from time to time.  Both her hands show significant arthritic changes around the IP joints.  The left knee though is getting worse for her.  It has been swelling.  When she goes from a sitting position to stand she has a hard time getting around.  HPI  Review of Systems She currently denies any headache, chest pain, shortness of breath, fever, chills,  nausea, vomiting  Objective: Vital Signs: LMP 12/09/2010   Physical Exam She is alert and orient x3 and in no acute distress Ortho Exam Examination of her left knee shows profound patellofemoral grinding and popping with an obvious shallow trochlear groove.  The patella seems to track centrally.  There is a slight effusion.  There is medial joint line tenderness and a positive McMurray's sign to the medial compartment.  Her Lockman's is negative. Specialty Comments:  No specialty comments available.  Imaging: Xr Knee 1-2 Views Left  Result Date: 01/04/2019 2 views of the left knee shows significant patellofemoral arthritic changes.  The medial lateral joint spaces are well-maintained.  The alignment is overall neutral.    PMFS History: Patient Active Problem List   Diagnosis Date Noted  . DJD (degenerative joint disease) 06/22/2013  . ALLERGIC RHINITIS 07/31/2009  . MIGRAINE, COMMON 06/18/2007   Past Medical History:  Diagnosis Date  . Allergy    seasonal  . Anemia 4/11    H/H 11.9/35.4  . Migraine     Family History  Problem Relation Age of Onset  . Breast cancer Mother   . Diabetes Mother   . Heart disease Father  angioplasty @ 33  . Ovarian cancer Maternal Aunt   . Lung cancer Maternal Uncle        smoker  . Breast cancer Maternal Aunt        X 2  . Lung cancer Maternal Aunt         smoker  . Atrial fibrillation Brother        S/P ablation  . Stroke Neg Hx   . Colon cancer Neg Hx     Past Surgical History:  Procedure Laterality Date  . APPENDECTOMY    . COLONOSCOPY  2005, 2015  . WISDOM TOOTH EXTRACTION     Social History   Occupational History  . Not on file  Tobacco Use  . Smoking status: Never Smoker  . Smokeless tobacco: Never Used  Substance and Sexual Activity  . Alcohol use: No  . Drug use: No  . Sexual activity: Not on file

## 2019-01-15 ENCOUNTER — Other Ambulatory Visit: Payer: Self-pay

## 2019-01-15 ENCOUNTER — Ambulatory Visit
Admission: RE | Admit: 2019-01-15 | Discharge: 2019-01-15 | Disposition: A | Discharge status: Home / Self Care | Payer: BLUE CROSS/BLUE SHIELD | Source: Ambulatory Visit | Attending: Orthopaedic Surgery | Admitting: Orthopaedic Surgery

## 2019-01-15 DIAGNOSIS — G8929 Other chronic pain: Secondary | ICD-10-CM

## 2019-01-15 DIAGNOSIS — M25562 Pain in left knee: Secondary | ICD-10-CM

## 2019-01-27 ENCOUNTER — Ambulatory Visit: Payer: BLUE CROSS/BLUE SHIELD | Admitting: Orthopaedic Surgery

## 2019-02-10 ENCOUNTER — Ambulatory Visit: Payer: BLUE CROSS/BLUE SHIELD | Admitting: Orthopaedic Surgery

## 2019-02-24 ENCOUNTER — Ambulatory Visit (INDEPENDENT_AMBULATORY_CARE_PROVIDER_SITE_OTHER): Payer: BLUE CROSS/BLUE SHIELD | Admitting: Orthopaedic Surgery

## 2019-02-24 ENCOUNTER — Encounter: Payer: Self-pay | Admitting: Orthopaedic Surgery

## 2019-02-24 DIAGNOSIS — M1712 Unilateral primary osteoarthritis, left knee: Secondary | ICD-10-CM

## 2019-02-24 NOTE — Progress Notes (Signed)
HPI: Diana Bradford returns today follow-up of her left knee status post MRI.  MRI images are reviewed with the patient and a of the left knee.  Images dated 01/15/2019 showed mild to moderate osteoarthritis in the medial compartment and severe patellofemoral arthritis with nearly clear completely denuded area of cartilage along the femoral trochlear region. States right now he is overall doing better.  She feels she just flared it up doing yoga.   Impression: Left knee osteoarthritis  Plan: Discussed with her quad strengthening exercises.  She will try turmeric or glucosamine/chondroitin.  Also over-the-counter NSAIDs.  Avoid deep squats lunges for exercise.  Consider possible steroid injection and/or supplemental injection in the future.  She will follow-up with [place] on as-needed basis.  Questions were encouraged and answered.

## 2019-03-11 ENCOUNTER — Other Ambulatory Visit: Payer: Self-pay | Admitting: Obstetrics and Gynecology

## 2019-03-11 DIAGNOSIS — Z1231 Encounter for screening mammogram for malignant neoplasm of breast: Secondary | ICD-10-CM

## 2019-03-17 ENCOUNTER — Ambulatory Visit
Admission: RE | Admit: 2019-03-17 | Discharge: 2019-03-17 | Disposition: A | Discharge status: Home / Self Care | Payer: BLUE CROSS/BLUE SHIELD | Source: Ambulatory Visit | Attending: Obstetrics and Gynecology | Admitting: Obstetrics and Gynecology

## 2019-03-17 ENCOUNTER — Other Ambulatory Visit: Payer: Self-pay

## 2019-03-17 DIAGNOSIS — Z1231 Encounter for screening mammogram for malignant neoplasm of breast: Secondary | ICD-10-CM

## 2020-02-08 ENCOUNTER — Other Ambulatory Visit: Payer: Self-pay | Admitting: Obstetrics and Gynecology

## 2020-02-08 DIAGNOSIS — Z1231 Encounter for screening mammogram for malignant neoplasm of breast: Secondary | ICD-10-CM

## 2020-03-21 ENCOUNTER — Ambulatory Visit: Payer: BLUE CROSS/BLUE SHIELD

## 2020-04-13 ENCOUNTER — Ambulatory Visit
Admission: RE | Admit: 2020-04-13 | Discharge: 2020-04-13 | Disposition: A | Discharge status: Home / Self Care | Payer: BLUE CROSS/BLUE SHIELD | Source: Ambulatory Visit | Attending: Obstetrics and Gynecology | Admitting: Obstetrics and Gynecology

## 2020-04-13 ENCOUNTER — Other Ambulatory Visit: Payer: Self-pay

## 2020-04-13 DIAGNOSIS — Z1231 Encounter for screening mammogram for malignant neoplasm of breast: Secondary | ICD-10-CM

## 2020-06-05 ENCOUNTER — Telehealth: Payer: Self-pay | Admitting: Nurse Practitioner

## 2020-06-05 NOTE — Telephone Encounter (Signed)
Called patient to discuss Covid symptoms and the use of casirivimab/imdevimab, a monoclonal antibody infusion for those with mild to moderate Covid symptoms and at a high risk of hospitalization.  Pt is qualified for this infusion at the [facility] due to; Specific high risk criteria : TBD   Message left to call back our hotline [phone].  Allie Gerhold, NP   

## 2021-03-20 ENCOUNTER — Other Ambulatory Visit: Payer: Self-pay | Admitting: Obstetrics and Gynecology

## 2021-03-20 DIAGNOSIS — Z1231 Encounter for screening mammogram for malignant neoplasm of breast: Secondary | ICD-10-CM

## 2021-03-23 ENCOUNTER — Other Ambulatory Visit: Payer: Self-pay | Admitting: Obstetrics and Gynecology

## 2021-03-23 DIAGNOSIS — E2839 Other primary ovarian failure: Secondary | ICD-10-CM

## 2021-04-11 ENCOUNTER — Ambulatory Visit: Payer: BLUE CROSS/BLUE SHIELD | Admitting: Orthopaedic Surgery

## 2021-04-12 ENCOUNTER — Ambulatory Visit: Payer: Self-pay

## 2021-04-12 ENCOUNTER — Other Ambulatory Visit: Payer: Self-pay

## 2021-04-12 ENCOUNTER — Ambulatory Visit (INDEPENDENT_AMBULATORY_CARE_PROVIDER_SITE_OTHER): Payer: BLUE CROSS/BLUE SHIELD | Admitting: Orthopaedic Surgery

## 2021-04-12 DIAGNOSIS — G8929 Other chronic pain: Secondary | ICD-10-CM

## 2021-04-12 DIAGNOSIS — M5442 Lumbago with sciatica, left side: Secondary | ICD-10-CM | POA: Diagnosis not present

## 2021-04-12 DIAGNOSIS — M549 Dorsalgia, unspecified: Secondary | ICD-10-CM

## 2021-04-12 MED ORDER — METHOCARBAMOL 500 MG PO TABS: 500.0000 mg | ORAL_TABLET | Freq: Four times a day (QID) | ORAL | 1 refills | Status: DC | PRN

## 2021-04-12 MED ORDER — CELECOXIB 200 MG PO CAPS: 200.0000 mg | ORAL_CAPSULE | Freq: Two times a day (BID) | ORAL | 3 refills | Status: DC

## 2021-04-12 NOTE — Progress Notes (Signed)
The patient is someone that I have not seen in a while.  She has been dealing with 6 months worth of low back pain in the thoracic and lumbar spine.  She denies any significant radicular components of her pain.  She is a thin individual.  She does have a known scoliosis.  She has good flexion-extension of the lumbar spine and can touch her toes easily.  She has 5 out of 5 strength of all lower extremities and good muscle tone bilaterally as well as normal sensation.  She has a lot of paraspinal muscle pain to the left side of the lower thoracic and upper lumbar spine.  2 views of the thoracic and lumbar spine show a significant S-shaped curve from her degenerative scoliosis.  She would definitely benefit from outpatient physical therapy to work on core strengthening and other modalities that can calm down her back pain and be able to give her a home exercise program eventually for maintenance.  We will try some Celebrex as an anti-inflammatory and I will see her back in about 6 weeks to see how she is done with physical therapy.

## 2021-04-30 ENCOUNTER — Ambulatory Visit
Admission: RE | Admit: 2021-04-30 | Discharge: 2021-04-30 | Disposition: A | Discharge status: Home / Self Care | Payer: BLUE CROSS/BLUE SHIELD | Source: Ambulatory Visit | Attending: Obstetrics and Gynecology | Admitting: Obstetrics and Gynecology

## 2021-04-30 DIAGNOSIS — Z1231 Encounter for screening mammogram for malignant neoplasm of breast: Secondary | ICD-10-CM

## 2021-05-07 ENCOUNTER — Other Ambulatory Visit: Payer: Self-pay

## 2021-05-07 ENCOUNTER — Encounter: Payer: Self-pay | Admitting: Physical Therapy

## 2021-05-07 ENCOUNTER — Ambulatory Visit (INDEPENDENT_AMBULATORY_CARE_PROVIDER_SITE_OTHER): Payer: BLUE CROSS/BLUE SHIELD | Admitting: Physical Therapy

## 2021-05-07 DIAGNOSIS — M5442 Lumbago with sciatica, left side: Secondary | ICD-10-CM | POA: Diagnosis not present

## 2021-05-07 DIAGNOSIS — G8929 Other chronic pain: Secondary | ICD-10-CM | POA: Diagnosis not present

## 2021-05-07 DIAGNOSIS — M6281 Muscle weakness (generalized): Secondary | ICD-10-CM | POA: Diagnosis not present

## 2021-05-07 NOTE — Patient Instructions (Signed)
Access Code: [email or web address] URL: [email or web address] Date: 05/07/2021 Prepared by: Narda Amber  Exercises Hooklying Clamshell with Resistance - 2 x daily - 7 x weekly - 2 sets - 10 reps - 5 seconds hold Supine Lower Trunk Rotation - 2 x daily - 7 x weekly - 3-5 reps - 20 seconds hold Supine Figure 4 Piriformis Stretch - 2 x daily - 7 x weekly - 3-5 reps - 20 seconds hold Standing Lumbar Extension at Wall - Forearms - 2 x daily - 7 x weekly - 10 reps - 5 seconds hold Hooklying Hamstring Stretch with Strap - 2 x daily - 7 x weekly - 3-5 reps - 20 seconds hold

## 2021-05-07 NOTE — Therapy (Signed)
[facility] [address] [place], [place], [address] Phone: [phone]   Fax:  [phone]  Physical Therapy Evaluation  Patient Details  Name: Diana Bradford MRN: [id number] Date of Birth: [birth year] Referring Provider (PT): Kennieth Francois, MD   Encounter Date: 05/07/2021   PT End of Session - 05/07/21 0903     Visit Number 1    Number of Visits 13    Date for PT Re-Evaluation 06/22/21    Progress Note Due on Visit 10    PT Start Time 0847    PT Stop Time 0930    PT Time Calculation (min) 43 min    Activity Tolerance Patient tolerated treatment well    Behavior During Therapy [facility] for tasks assessed/performed             Past Medical History:  Diagnosis Date   Allergy    seasonal   Anemia 4/11    H/H 11.9/35.4   Migraine     Past Surgical History:  Procedure Laterality Date   APPENDECTOMY     COLONOSCOPY  2005, 2015   WISDOM TOOTH EXTRACTION      There were no vitals filed for this visit.    Subjective Assessment - 05/07/21 0854     Subjective Pt arriving reporting chronic low back pain which began about 6 months ago with worsening toward mid summer. Pt reports taking aleve for pain as needed. Pt reporting her pain can limit her community activities due to level of pain. 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    Pertinent History scoliosis, OA, migraines    Limitations Other (comment);House hold activities;Walking;Lifting    Diagnostic tests degenerative scoliosis thoracic to lumbar spine    Patient Stated Goals get back to yoga and pilates    Currently in Pain? 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  No      Home Environment   Living Environment Private residence    Living Arrangements Spouse/significant other    Type of Home House    Home Access Stairs to enter    Entrance Stairs-Number of Steps 5    Entrance Stairs-Rails Can reach both    Home Layout Two level;Able to live on main level with bedroom/bathroom      Prior Function   Level of Independence Independent    Vocation Full  time employment    Vocation Requirements office work, Recruitment consultant on Physicist, medical   Overall Cognitive Status Within Functional Limits for tasks assessed      Observation/Other Assessments   Focus on Therapeutic Outcomes (FOTO)  56% (predicted 64%)      Posture/Postural Control   Posture/Postural Control Postural limitations    Postural Limitations Rounded Shoulders;Forward head      ROM / Strength   AROM / PROM / Strength AROM;Strength      AROM   AROM Assessment Site Lumbar    Lumbar Flexion 78   with mild ache in left sided low back and tightness   Lumbar Extension 25    Lumbar - Right Side Bend 28   painful down left low back   Lumbar - Left Side Bend 40    Lumbar - Right Rotation WFL   tightness noted on left side   Lumbar - Left Rotation [facility]      Strength   Overall Strength Deficits    Strength Assessment Site Knee;Hip    Right/Left Hip Right;Left    Right Hip Flexion 5/5    Right Hip ABduction 5/5    Right Hip ADduction  5/5    Left Hip Flexion 4+/5    Left Hip ABduction 4+/5    Left Hip ADduction 4+/5    Right/Left Knee Right;Left    Right Knee Flexion 5/5    Right Knee Extension 5/5    Left Knee Flexion 4+/5    Left Knee Extension 5/5      Palpation   Palpation comment TTP: thoraic and lumbar paraspinals, left QL and left ilica crest border.      Special Tests   Other special tests negative slump test on the left and negative SLR on left      Transfers   Five time sit to stand comments  20 seconds with no UE support      Ambulation/Gait   Gait Pattern Within Functional Limits                        Objective measurements completed on examination: See above findings.       [facility] Adult PT Treatment/Exercise - 05/07/21 0001       Exercises   Exercises Lumbar      Lumbar Exercises: Stretches   Active Hamstring Stretch Right;Left;2 reps;20 seconds    Lower Trunk Rotation 2 reps;20 seconds    Piriformis Stretch 2 reps;20 seconds      Lumbar Exercises: Standing   Other Standing Lumbar Exercises trunk extension x 10 holding 5 seconds      Lumbar Exercises: Supine   Bridge with clamshell 5 reps;5 seconds                     PT Education - 05/07/21 0902     Education Details PT POC, HEP, discussed DN    Person(s) Educated Patient    Methods Explanation;Demonstration;Handout;Tactile cues;Verbal cues    Comprehension Verbalized understanding;Returned demonstration              PT Short Term Goals - 05/07/21 1008       PT SHORT TERM GOAL #1   Title Pt will be able to perform initial HEP independently    Time 3    Period Weeks    Status New    Target Date 06/01/21      PT SHORT TERM GOAL #2  Title Pt will improve her 5 time sit to stand to </= 14 seconds with no UE support    Time 3    Period Weeks    Status New    Target Date 06/01/21               PT Long Term Goals - 05/07/21 1009       PT LONG TERM GOAL #1   Title Pt will be  independent in her advanced HEP    Time 8    Period Weeks    Status New    Target Date 07/06/21      PT LONG TERM GOAL #2   Title Pt will improve her left hip strength to 5/5 in order to improve functional mobility.    Time 8    Period Weeks    Status New    Target Date 07/06/21      PT LONG TERM GOAL #3   Title Pt will be abe to report no pain with lifting a 10# object from floor to counter height.    Time 8    Period Weeks    Status New    Target Date 07/06/21      PT LONG TERM GOAL #4   Title Pt will improve her FOTO to >/= 64%.    Baseline 56% on 05/07/2021.    Time 8    Period Weeks    Status New    Target Date 07/06/21                    Plan - 05/07/21 1014     Clinical Impression Statement Pt presenting with chronic left sided low back pain of 2/10 at present. Pt with dx of degenerative scoliosis ranging from thoracic to lumbar spine. Pt with tightness noted in her her left hamstrings compared to her right with tenderness noted in lower thoracic paraspinals on the left and Left QL. Pt also with pain noted ilica crest on the left. Pt with good response to extension exercises. Pt was edu in DN for low lumbar paraspinals at a further visit with further evaluation of her lumbar scoliosis. Pt was also issued a HEP for core strengthening, and stretching. Skilled PT needed to address pt's iimpairments with the below interventions.    Personal Factors and Comorbidities Comorbidity 3+    Comorbidities degenerative scoliosis thoracic and lumbar spine, migraines, OA    Examination-Activity Limitations Squat;Lift;Other    Examination-Participation Restrictions Community Activity;Other    Stability/Clinical Decision Making Stable/Uncomplicated    Clinical Decision Making Low    Rehab Potential Good    PT Frequency 1x / week    PT Duration 8 weeks    PT Treatment/Interventions ADLs/Self Care Home Management;Cryotherapy;Electrical Stimulation;Iontophoresis 4mg /ml  Dexamethasone;Moist Heat;Traction;Ultrasound;Therapeutic activities;Stair training;Gait training;Therapeutic exercise;Balance training;Neuromuscular re-education;Patient/family education;Manual techniques;Dry needling;Passive range of motion;Taping;Joint Manipulations;Spinal Manipulations;Functional mobility training    PT Next Visit Plan issued DN handout, lumbar stretching, core strengthening, add to pt's HEP as appropriate, progress extension based exericses    PT Home Exercise Plan Access Code:  URL: [email or web address]  Date: 05/07/2021  Prepared by: 05/09/2021     Exercises  Hooklying Clamshell with Resistance - 2 x daily - 7 x weekly - 2 sets - 10 reps - 5 seconds hold  Supine Lower Trunk Rotation - 2 x daily - 7 x weekly - 3-5 reps - 20 seconds hold  Supine Figure 4 Piriformis Stretch - 2 x daily - 7 x weekly -  3-5 reps - 20 seconds hold  Standing Lumbar Extension at Wall - Forearms - 2 x daily - 7 x weekly - 10 reps - 5 seconds hold  Hooklying Hamstring Stretch with Strap - 2 x daily - 7 x weekly - 3-5 reps - 20 seconds hold    Consulted and Agree with Plan of Care Patient             Patient will benefit from skilled therapeutic intervention in order to improve the following deficits and impairments:  Difficulty walking, Decreased activity tolerance, Decreased range of motion, Decreased strength, Postural dysfunction, Pain  Visit Diagnosis: Chronic left-sided low back pain with left-sided sciatica  Muscle weakness (generalized)     Problem List Patient Active Problem List   Diagnosis Date Noted   DJD (degenerative joint disease) 06/22/2013   ALLERGIC RHINITIS 07/31/2009   MIGRAINE, COMMON 06/18/2007    Sharmon Leyden, PT, MPT 05/07/2021, 10:44 AM  [facility] Physical Therapy [address] [place], [place], [address] Phone: [phone]   Fax:  [phone]  Name: Diana Bradford MRN: [id number] Date of Birth:  [birth year]

## 2021-05-21 ENCOUNTER — Encounter: Payer: BLUE CROSS/BLUE SHIELD | Admitting: Physical Therapy

## 2021-05-28 ENCOUNTER — Encounter: Payer: BLUE CROSS/BLUE SHIELD | Admitting: Physical Therapy

## 2021-05-29 ENCOUNTER — Ambulatory Visit (INDEPENDENT_AMBULATORY_CARE_PROVIDER_SITE_OTHER): Payer: BLUE CROSS/BLUE SHIELD | Admitting: Physician Assistant

## 2021-05-29 ENCOUNTER — Encounter: Payer: Self-pay | Admitting: Physician Assistant

## 2021-05-29 DIAGNOSIS — M549 Dorsalgia, unspecified: Secondary | ICD-10-CM

## 2021-05-29 NOTE — Progress Notes (Signed)
° °  HPI: Diana Bradford returns to follow-up of her thoracic and lower back pain.  She has been to just a few visits with physical therapy due to scheduling issues both of her own and therapy.  She has had no change in her symptoms.  She is having no waking pain no saddle anesthesia like symptoms or bowel or bladder dysfunction.  Therefore discussed with her that we will do see her back in 6 to 8 weeks to see how she is doing.  Today we will not charge her for an office visit.  She will follow-up sooner if her condition deteriorates.  Questions were encouraged and answered.

## 2021-06-05 ENCOUNTER — Ambulatory Visit (INDEPENDENT_AMBULATORY_CARE_PROVIDER_SITE_OTHER): Payer: BLUE CROSS/BLUE SHIELD | Admitting: Physical Therapy

## 2021-06-05 ENCOUNTER — Other Ambulatory Visit: Payer: Self-pay

## 2021-06-05 ENCOUNTER — Encounter: Payer: Self-pay | Admitting: Physical Therapy

## 2021-06-05 DIAGNOSIS — M6281 Muscle weakness (generalized): Secondary | ICD-10-CM | POA: Diagnosis not present

## 2021-06-05 DIAGNOSIS — M5442 Lumbago with sciatica, left side: Secondary | ICD-10-CM | POA: Diagnosis not present

## 2021-06-05 DIAGNOSIS — G8929 Other chronic pain: Secondary | ICD-10-CM | POA: Diagnosis not present

## 2021-06-05 NOTE — Therapy (Addendum)
[facility] [address] [place], [place], [address] Phone: [phone]   Fax:  [phone]  Physical Therapy Treatment /Discharge  Patient Details  Name: Diana Bradford MRN: [id number] Date of Birth: [birth year] Referring Provider (PT): Laverle Hobby, MD   Encounter Date: 06/05/2021   PT End of Session - 06/05/21 0938     Visit Number 2    Number of Visits 13    Date for PT Re-Evaluation 06/22/21    Progress Note Due on Visit 10    PT Start Time 0930    PT Stop Time 1016    PT Time Calculation (min) 46 min    Activity Tolerance Patient tolerated treatment well    Behavior During Therapy [facility] for tasks assessed/performed             Past Medical History:  Diagnosis Date   Allergy    seasonal   Anemia 4/11    H/H 11.9/35.4   Migraine     Past Surgical History:  Procedure Laterality Date   APPENDECTOMY     COLONOSCOPY  2005, 2015   WISDOM TOOTH EXTRACTION      There were no vitals filed for this visit.   Subjective Assessment - 06/05/21 0937     Subjective Pt arriving reporting 2/10 pain in her left low back. Pt stating she has been doing her HEP.    Pertinent History scoliosis, OA, migraines    Limitations Other (comment);House hold activities;Walking;Lifting    Patient Stated Goals get back to yoga and pilates    Currently in Pain? Yes    Pain Score 2     Pain Orientation Lower;Left    Pain Descriptors / Indicators Aching    Pain Type Chronic pain    Pain Onset More than a month ago                               [facility] Adult PT Treatment/Exercise - 06/05/21 0001       Exercises   Exercises Lumbar      Lumbar Exercises: Stretches   Piriformis Stretch 2 reps;20 seconds    Figure 4 Stretch 2 reps;30 seconds      Lumbar Exercises: Aerobic   Nustep L5 x 6 minutes      Lumbar Exercises: Machines for Strengthening   Leg Press 75# 2x10 with core activation, slow eccentrics       Lumbar Exercises: Standing   Row --    Theraband Level (Row) --    Other Standing Lumbar Exercises --      Lumbar Exercises: Prone   Other Prone Lumbar Exercises prone press ups holiding 10 seconds x 5 reps      Lumbar Exercises: Quadruped   Madcat/Old Horse 10 reps    Straight Leg Raise 5 reps;5 seconds    Straight Leg Raises Limitations cues for hips not to rotate    Plank elbow plank: 10 seconds    Other Quadruped Lumbar Exercises raise bilateral UE's holding 5-10 seconds      Manual Therapy   Manual therapy comments skilled palpation in lumbar paraspinals, STM to left QL and lower thoracic paraspinals              Trigger Point Dry Needling - 06/05/21 0001     Consent Given? Yes    Education Handout Provided Yes    Muscles Treated Back/Hip Lumbar multifidi    Dry Needling Comments Pt  tolerating well with multiple twich responses noted on the left side    Lumbar multifidi Response Twitch response elicited                     PT Short Term Goals - 06/05/21 0944       PT SHORT TERM GOAL #1   Title Pt will be able to perform initial HEP independently    Status On-going      PT SHORT TERM GOAL #2   Title Pt will improve her 5 time sit to stand to </= 14 seconds with no UE support    Status On-going      PT SHORT TERM GOAL #3   Status On-going               PT Long Term Goals - 05/07/21 1009       PT LONG TERM GOAL #1   Title Pt will be independent in her advanced HEP    Time 8    Period Weeks    Status New    Target Date 07/06/21      PT LONG TERM GOAL #2   Title Pt will improve her left hip strength to 5/5 in order to improve functional mobility.    Time 8    Period Weeks    Status New    Target Date 07/06/21      PT LONG TERM GOAL #3   Title Pt will be abe to report no pain with lifting a 10# object from floor to counter height.    Time 8    Period Weeks    Status New    Target Date 07/06/21      PT LONG TERM GOAL #4   Title  Pt will improve her FOTO to >/= 64%.    Baseline 56% on 05/07/2021.    Time 8    Period Weeks    Status New    Target Date 07/06/21                   Plan - 06/05/21 0939     Clinical Impression Statement Pt reporting 2/10 pain in her left low back today after the holiday. Pt reporting compliance with her HEP. Pt tolerating exercises well for strengthening and stretching. DN performed to lumbar multifidi with twitch response noted more on the left. Bridge progression exercises added to pt;s HEP this visit. Next visit assess response to DN. Continue skilled PT to maximize pt's funciton.    Personal Factors and Comorbidities Comorbidity 3+    Comorbidities degenerative scoliosis thoracic and lumbar spine, migraines, OA    Examination-Activity Limitations Squat;Lift;Other    Examination-Participation Restrictions Community Activity;Other    Stability/Clinical Decision Making Stable/Uncomplicated    Rehab Potential Good    PT Frequency 1x / week    PT Duration 8 weeks    PT Treatment/Interventions ADLs/Self Care Home Management;Cryotherapy;Electrical Stimulation;Iontophoresis 27m/ml Dexamethasone;Moist Heat;Traction;Ultrasound;Therapeutic activities;Stair training;Gait training;Therapeutic exercise;Balance training;Neuromuscular re-education;Patient/family education;Manual techniques;Dry needling;Passive range of motion;Taping;Joint Manipulations;Spinal Manipulations;Functional mobility training    PT Next Visit Plan asess response to DN, lumbar stretching, core strengthening, add to pt's HEP as appropriate, progress extension based exericses    PT Home Exercise Plan [id number] with added bridge progression exercises and prone press ups    Consulted and Agree with Plan of Care Patient             Patient will benefit from skilled therapeutic intervention in order to improve the following deficits  and impairments:  Difficulty walking, Decreased activity tolerance, Decreased range of  motion, Decreased strength, Postural dysfunction, Pain  Visit Diagnosis: Chronic left-sided low back pain with left-sided sciatica  Muscle weakness (generalized)     Problem List Patient Active Problem List   Diagnosis Date Noted   DJD (degenerative joint disease) 06/22/2013   ALLERGIC RHINITIS 07/31/2009   MIGRAINE, COMMON 06/18/2007    Oretha Caprice, PT, MPT 06/05/2021, 10:35 AM  PHYSICAL THERAPY DISCHARGE SUMMARY  Visits from Start of Care: 2  Current functional level related to goals / functional outcomes: See note   Remaining deficits: See note   Education / Equipment: HEP   Patient agrees to discharge. Patient goals were partially met. Patient is being discharged due to not returning since the last visit.  Scot Jun, PT, DPT, OCS, ATC 07/23/21  3:19 PM     [facility] [address] [place], [place], [address] Phone: [phone]   Fax:  [phone]  Name: Diana Bradford MRN: [id number] Date of Birth: 11/21/1960

## 2021-06-13 ENCOUNTER — Encounter: Payer: BLUE CROSS/BLUE SHIELD | Admitting: Physical Therapy

## 2021-06-18 ENCOUNTER — Encounter: Payer: BLUE CROSS/BLUE SHIELD | Admitting: Physical Therapy

## 2021-06-20 ENCOUNTER — Encounter: Payer: BLUE CROSS/BLUE SHIELD | Admitting: Physical Therapy

## 2021-06-27 ENCOUNTER — Encounter: Payer: BLUE CROSS/BLUE SHIELD | Admitting: Physical Therapy

## 2021-07-02 ENCOUNTER — Encounter: Payer: BLUE CROSS/BLUE SHIELD | Admitting: Physical Therapy

## 2021-07-16 ENCOUNTER — Encounter: Payer: BLUE CROSS/BLUE SHIELD | Admitting: Physical Therapy

## 2021-08-30 ENCOUNTER — Ambulatory Visit
Admission: RE | Admit: 2021-08-30 | Discharge: 2021-08-30 | Disposition: A | Discharge status: Home / Self Care | Payer: BLUE CROSS/BLUE SHIELD | Source: Ambulatory Visit | Attending: Obstetrics and Gynecology | Admitting: Obstetrics and Gynecology

## 2021-08-30 DIAGNOSIS — E2839 Other primary ovarian failure: Secondary | ICD-10-CM

## 2022-03-27 ENCOUNTER — Other Ambulatory Visit: Payer: Self-pay | Admitting: Obstetrics and Gynecology

## 2022-03-27 DIAGNOSIS — Z1231 Encounter for screening mammogram for malignant neoplasm of breast: Secondary | ICD-10-CM

## 2022-04-22 ENCOUNTER — Other Ambulatory Visit: Payer: Self-pay | Admitting: Orthopaedic Surgery

## 2022-05-15 ENCOUNTER — Ambulatory Visit
Admission: RE | Admit: 2022-05-15 | Discharge: 2022-05-15 | Disposition: A | Discharge status: Home / Self Care | Payer: BLUE CROSS/BLUE SHIELD | Source: Ambulatory Visit | Attending: Obstetrics and Gynecology | Admitting: Obstetrics and Gynecology

## 2022-05-15 DIAGNOSIS — Z1231 Encounter for screening mammogram for malignant neoplasm of breast: Secondary | ICD-10-CM

## 2022-05-16 ENCOUNTER — Other Ambulatory Visit: Payer: Self-pay | Admitting: Obstetrics and Gynecology

## 2022-05-16 DIAGNOSIS — R928 Other abnormal and inconclusive findings on diagnostic imaging of breast: Secondary | ICD-10-CM

## 2022-05-28 ENCOUNTER — Ambulatory Visit: Payer: BLUE CROSS/BLUE SHIELD

## 2022-05-28 ENCOUNTER — Ambulatory Visit
Admission: RE | Admit: 2022-05-28 | Discharge: 2022-05-28 | Disposition: A | Discharge status: Home / Self Care | Payer: BLUE CROSS/BLUE SHIELD | Source: Ambulatory Visit | Attending: Obstetrics and Gynecology | Admitting: Obstetrics and Gynecology

## 2022-05-28 DIAGNOSIS — R928 Other abnormal and inconclusive findings on diagnostic imaging of breast: Secondary | ICD-10-CM

## 2022-06-17 ENCOUNTER — Other Ambulatory Visit: Payer: Self-pay | Admitting: Internal Medicine

## 2022-06-17 DIAGNOSIS — R109 Unspecified abdominal pain: Secondary | ICD-10-CM

## 2022-07-22 ENCOUNTER — Ambulatory Visit
Admission: RE | Admit: 2022-07-22 | Discharge: 2022-07-22 | Disposition: A | Discharge status: Home / Self Care | Payer: BLUE CROSS/BLUE SHIELD | Source: Ambulatory Visit | Attending: Internal Medicine | Admitting: Internal Medicine

## 2022-07-22 DIAGNOSIS — R109 Unspecified abdominal pain: Secondary | ICD-10-CM

## 2022-07-22 MED ORDER — IOPAMIDOL (ISOVUE-300) INJECTION 61%
100.0000 mL | Freq: Once | INTRAVENOUS | Status: AC | PRN
  Administered 2022-07-22: 100 mL via INTRAVENOUS

## 2023-05-15 ENCOUNTER — Other Ambulatory Visit: Payer: Self-pay | Admitting: Obstetrics and Gynecology

## 2023-05-15 DIAGNOSIS — Z0000 Encounter for general adult medical examination without abnormal findings: Secondary | ICD-10-CM

## 2023-06-18 ENCOUNTER — Ambulatory Visit
Admission: RE | Admit: 2023-06-18 | Discharge: 2023-06-18 | Disposition: A | Discharge status: Home / Self Care | Payer: BC Managed Care – PPO | Source: Ambulatory Visit | Attending: Obstetrics and Gynecology | Admitting: Obstetrics and Gynecology

## 2023-06-18 DIAGNOSIS — Z0000 Encounter for general adult medical examination without abnormal findings: Secondary | ICD-10-CM

## 2023-07-23 ENCOUNTER — Encounter: Payer: Self-pay | Admitting: Internal Medicine

## 2023-08-06 ENCOUNTER — Encounter: Payer: Self-pay | Admitting: Internal Medicine

## 2023-08-19 ENCOUNTER — Other Ambulatory Visit (HOSPITAL_BASED_OUTPATIENT_CLINIC_OR_DEPARTMENT_OTHER): Payer: Self-pay | Admitting: Obstetrics and Gynecology

## 2023-08-19 DIAGNOSIS — M810 Age-related osteoporosis without current pathological fracture: Secondary | ICD-10-CM

## 2023-09-10 ENCOUNTER — Ambulatory Visit (HOSPITAL_BASED_OUTPATIENT_CLINIC_OR_DEPARTMENT_OTHER)
Admission: RE | Admit: 2023-09-10 | Discharge: 2023-09-10 | Disposition: A | Discharge status: Home / Self Care | Source: Ambulatory Visit | Attending: Obstetrics and Gynecology | Admitting: Obstetrics and Gynecology

## 2023-09-10 DIAGNOSIS — M810 Age-related osteoporosis without current pathological fracture: Secondary | ICD-10-CM | POA: Insufficient documentation

## 2023-10-06 ENCOUNTER — Ambulatory Visit (AMBULATORY_SURGERY_CENTER): Payer: BC Managed Care – PPO

## 2023-10-06 VITALS — Ht 68.0 in | Wt 145.0 lb

## 2023-10-06 DIAGNOSIS — Z1211 Encounter for screening for malignant neoplasm of colon: Secondary | ICD-10-CM

## 2023-10-06 MED ORDER — NA SULFATE-K SULFATE-MG SULF 17.5-3.13-1.6 GM/177ML PO SOLN: 1.0000 | Freq: Once | ORAL | 0 refills | Status: AC

## 2023-10-06 NOTE — Progress Notes (Signed)
 No egg or soy allergy known to patient  No issues known to pt with past sedation with any surgeries or procedures Patient denies ever being told they had issues or difficulty with intubation  No FH of Malignant Hyperthermia Pt is not on diet pills Pt is not on  home 02  Pt is not on blood thinners  Pt denies issues with constipation  No A fib or A flutter Have any cardiac testing pending-- mo  LOA: independent  Prep: suprep   Patient's chart reviewed by Cathlyn Parsons CNRA prior to previsit and patient appropriate for the [facility].  Previsit completed and red dot placed by patient's name on their procedure day (on provider's schedule).     PV completed with patient. Prep instructions sent via mychart and home address.

## 2023-10-08 ENCOUNTER — Encounter: Payer: Self-pay | Admitting: Internal Medicine

## 2023-10-19 NOTE — Progress Notes (Unsigned)
 [facility] History and Physical   Primary Care Physician:  Aldo Hun, MD   Reason for Procedure:  Colon cancer screening  Plan:    Colonoscopy     HPI: Diana Bradford is a 63 y.o. female presenting for a repeat screening colonoscopy.  In February 2015 she had a normal colonoscopy using an adult colonoscope.   Past Medical History:  Diagnosis Date   Allergy    seasonal   Anemia 4/11    H/H 11.9/35.4   Migraine     Past Surgical History:  Procedure Laterality Date   APPENDECTOMY     COLONOSCOPY  2005, 2015   WISDOM TOOTH EXTRACTION      Prior to Admission medications   Medication Sig Start Date End Date Taking? Authorizing Provider  CALCIUM PO Take 1,200 mg by mouth daily.    [provider]  cetirizine (ZYRTEC) 10 MG tablet Take 10 mg by mouth daily as needed for allergies. 08/06/21   [provider]  Cholecalciferol 50 MCG (2000 UT) TABS Take 1 tablet by mouth daily. 11/25/16   [provider]  clobetasol cream (TEMOVATE) 0.05 %  01/27/14   [provider]  doxycycline (VIBRAMYCIN) 100 MG capsule Take 100 mg by mouth 2 (two) times daily. 09/10/23   [provider]  ESTRACE VAGINAL 0.1 MG/GM vaginal cream  01/27/14   [provider]  methocarbamol  (ROBAXIN ) 500 MG tablet TAKE 1 TABLET BY MOUTH EVERY 6 HOURS AS NEEDED. Patient not taking: Reported on 10/06/2023 04/23/22   Arnie Lao, MD  SUMAtriptan  (IMITREX ) 100 MG tablet TAKE 1 TABLET EVERY 2 HOURS AS NEEDED FOR MIGRAINE. Patient not taking: Reported on 10/06/2023 09/15/13   Alinda Apley, MD    Current Outpatient Medications  Medication Sig Dispense Refill   Cholecalciferol 50 MCG (2000 UT) TABS Take 1 tablet by mouth daily.     doxycycline (VIBRAMYCIN) 100 MG capsule Take 100 mg by mouth 2 (two) times daily.     CALCIUM PO Take 1,200 mg by mouth daily.     cetirizine (ZYRTEC) 10 MG tablet Take 10 mg by mouth daily as needed for  allergies.     clobetasol cream (TEMOVATE) 0.05 %  (Patient not taking: Reported on 10/06/2023)     ESTRACE VAGINAL 0.1 MG/GM vaginal cream  (Patient not taking: Reported on 10/06/2023)     methocarbamol  (ROBAXIN ) 500 MG tablet TAKE 1 TABLET BY MOUTH EVERY 6 HOURS AS NEEDED. (Patient not taking: Reported on 10/06/2023) 40 tablet 1   SUMAtriptan  (IMITREX ) 100 MG tablet TAKE 1 TABLET EVERY 2 HOURS AS NEEDED FOR MIGRAINE. (Patient not taking: Reported on 10/06/2023) 9 tablet 1   Current Facility-Administered Medications  Medication Dose Route Frequency Provider Last Rate Last Admin   0.9 %  sodium chloride  infusion  500 mL Intravenous Continuous Kenney Peacemaker, MD        Allergies as of 10/20/2023   (No Known Allergies)    Family History  Problem Relation Age of Onset   Breast cancer Mother    Diabetes Mother    Heart disease Father        angioplasty @ 62   Atrial fibrillation Brother        S/P ablation   Ovarian cancer Maternal Aunt    Breast cancer Maternal Aunt        X 2   Lung cancer Maternal Aunt         smoker   Lung cancer Maternal  Uncle        smoker   Stroke Neg Hx    Colon cancer Neg Hx    Rectal cancer Neg Hx    Stomach cancer Neg Hx     Social History   Socioeconomic History   Marital status: Single    Spouse name: Not on file   Number of children: Not on file   Years of education: Not on file   Highest education level: Not on file  Occupational History   Not on file  Tobacco Use   Smoking status: Never   Smokeless tobacco: Never  Vaping Use   Vaping status: Never Used  Substance and Sexual Activity   Alcohol use: Yes    Alcohol/week: 1.0 standard drink of alcohol    Types: 1 Glasses of wine per week    Comment: occ   Drug use: No   Sexual activity: Not on file  Other Topics Concern   Not on file  Social History Narrative   Not on file   Social Drivers of Health   Financial Resource Strain: Not on file  Food Insecurity: Not on file   Transportation Needs: Not on file  Physical Activity: Not on file  Stress: Not on file  Social Connections: Not on file  Intimate Partner Violence: Not on file    Review of Systems:  All other review of systems negative except as mentioned in the HPI.  Physical Exam: Vital signs BP 107/74   Pulse 82   Temp 98.2 F (36.8 C)   Ht 5\' 8"  (1.727 m)   Wt 146 lb (66.2 kg)   LMP 12/09/2010   SpO2 99%   BMI 22.20 kg/m   General:   Alert,  Well-developed, well-nourished, pleasant and cooperative in NAD Lungs:  Clear throughout to auscultation.   Heart:  Regular rate and rhythm; no murmurs, clicks, rubs,  or gallops. Abdomen:  Soft, nontender and nondistended. Normal bowel sounds.   Neuro/Psych:  Alert and cooperative. Normal mood and affect. A and O x 3   @Josuel Koeppen  Tammie Fall, MD, [facility] [phone] (pager) 10/20/2023 8:39 AM@

## 2023-10-20 ENCOUNTER — Encounter: Payer: Self-pay | Admitting: Internal Medicine

## 2023-10-20 ENCOUNTER — Ambulatory Visit (AMBULATORY_SURGERY_CENTER): Payer: BC Managed Care – PPO | Admitting: Internal Medicine

## 2023-10-20 VITALS — BP 123/82 | HR 77 | Temp 98.2°F | Resp 18 | Ht 68.0 in | Wt 146.0 lb

## 2023-10-20 DIAGNOSIS — K648 Other hemorrhoids: Secondary | ICD-10-CM

## 2023-10-20 DIAGNOSIS — K635 Polyp of colon: Secondary | ICD-10-CM | POA: Diagnosis not present

## 2023-10-20 DIAGNOSIS — D120 Benign neoplasm of cecum: Secondary | ICD-10-CM

## 2023-10-20 DIAGNOSIS — K5730 Diverticulosis of large intestine without perforation or abscess without bleeding: Secondary | ICD-10-CM

## 2023-10-20 DIAGNOSIS — Z1211 Encounter for screening for malignant neoplasm of colon: Secondary | ICD-10-CM | POA: Diagnosis present

## 2023-10-20 MED ORDER — SODIUM CHLORIDE 0.9 % IV SOLN: 500.0000 mL | INTRAVENOUS | Status: DC

## 2023-10-20 NOTE — Progress Notes (Signed)
 To pacu, VSS. Report to [email or web address].tb

## 2023-10-20 NOTE — Op Note (Addendum)
 [facility] Patient Name: Diana Bradford Procedure Date: 10/20/2023 8:45 AM MRN: [id number] Endoscopist: Kenney Peacemaker , MD, [id number] Age: 63 Referring MD:  Date of Birth: September 02, 1960 Gender: Female Account #: [id number] Procedure:                Colonoscopy Indications:              Screening for colorectal malignant neoplasm, Last                            colonoscopy: 2015 Medicines:                Monitored Anesthesia Care Procedure:                Pre-Anesthesia Assessment:                           - Prior to the procedure, a History and Physical                            was performed, and patient medications and                            allergies were reviewed. The patient's tolerance of                            previous anesthesia was also reviewed. The risks                            and benefits of the procedure and the sedation                            options and risks were discussed with the patient.                            All questions were answered, and informed consent                            was obtained. Prior Anticoagulants: The patient has                            taken no anticoagulant or antiplatelet agents. ASA                            Grade Assessment: II - A patient with mild systemic                            disease. After reviewing the risks and benefits,                            the patient was deemed in satisfactory condition to                            undergo the procedure.  After obtaining informed consent, the colonoscope                            was passed under direct vision. Throughout the                            procedure, the patient's blood pressure, pulse, and                            oxygen saturations were monitored continuously. The                            Olympus Scope [id number] was introduced through the                            anus and advanced to the the cecum,  identified by                            appendiceal orifice and ileocecal valve. The                            colonoscopy was performed without difficulty. The                            patient tolerated the procedure well. The quality                            of the bowel preparation was good. The ileocecal                            valve, appendiceal orifice, and rectum were                            photographed. The bowel preparation used was SUPREP                            via split dose instruction. Scope In: 8:50:38 AM Scope Out: 9:06:39 AM Scope Withdrawal Time: 0 hours 13 minutes 6 seconds  Total Procedure Duration: 0 hours 16 minutes 1 second  Findings:                 The perianal and digital rectal examinations were                            normal.                           A 1 to 2 mm polyp was found in the cecum. The polyp                            was sessile. The polyp was removed with a cold                            biopsy forceps. Resection and retrieval were  complete. Verification of patient identification                            for the specimen was done. Estimated blood loss was                            minimal.                           Multiple small-mouthed diverticula were found in                            the sigmoid colon.                           Internal hemorrhoids were found.                           The exam was otherwise without abnormality on                            direct and retroflexion views. Complications:            No immediate complications. Estimated Blood Loss:     Estimated blood loss was minimal. Impression:               - One 1 to 2 mm polyp in the cecum, removed with a                            cold biopsy forceps. Resected and retrieved.                           - Diverticulosis in the sigmoid colon.                           - Internal hemorrhoids.                           - The  examination was otherwise normal on direct                            and retroflexion views. Recommendation:           - Patient has a contact number available for                            emergencies. The signs and symptoms of potential                            delayed complications were discussed with the                            patient. Return to normal activities tomorrow.                            Written discharge instructions were provided to the  patient.                           - Resume previous diet.                           - Continue present medications.                           - Repeat colonoscopy is recommended. The                            colonoscopy date will be determined after pathology                            results from today's exam become available for                            review. Kenney Peacemaker, MD 10/20/2023 9:19:26 AM This report has been signed electronically.

## 2023-10-20 NOTE — Progress Notes (Signed)
 Called to room to assist during endoscopic procedure.  Patient ID and intended procedure confirmed with present staff. Received instructions for my participation in the procedure from the performing physician.

## 2023-10-20 NOTE — Progress Notes (Signed)
 Pt's states no medical or surgical changes since previsit or office visit.

## 2023-10-20 NOTE — Patient Instructions (Addendum)
 There was a tiny polyp removed. I will let you know pathology results and when to have another routine colonoscopy by mail and/or My Chart.  You also have a condition called diverticulosis - common and not usually a problem. Please read the handout provided. Internal hemorrhoids were also seen - very common also and not usually a problem.  I appreciate the opportunity to care for you. Kenney Peacemaker, MD, [facility]   Thank you for letting us  take care of your healthcare needs today. Please see handouts given to you on Polyps and Diverticulosis.  YOU HAD AN ENDOSCOPIC PROCEDURE TODAY AT THE [facility]:   Refer to the procedure report that was given to you for any specific questions about what was found during the examination.  If the procedure report does not answer your questions, please call your gastroenterologist to clarify.  If you requested that your care partner not be given the details of your procedure findings, then the procedure report has been included in a sealed envelope for you to review at your convenience later.  YOU SHOULD EXPECT: Some feelings of bloating in the abdomen. Passage of more gas than usual.  Walking can help get rid of the air that was put into your GI tract during the procedure and reduce the bloating. If you had a lower endoscopy (such as a colonoscopy or flexible sigmoidoscopy) you may notice spotting of blood in your stool or on the toilet paper. If you underwent a bowel prep for your procedure, you may not have a normal bowel movement for a few days.  Please Note:  You might notice some irritation and congestion in your nose or some drainage.  This is from the oxygen used during your procedure.  There is no need for concern and it should clear up in a day or so.  SYMPTOMS TO REPORT IMMEDIATELY:  Following lower endoscopy (colonoscopy or flexible sigmoidoscopy):  Excessive amounts of blood in the stool  Significant tenderness or worsening of abdominal  pains  Swelling of the abdomen that is new, acute  Fever of 100F or higher   For urgent or emergent issues, a gastroenterologist can be reached at any hour by calling [phone]. Do not use MyChart messaging for urgent concerns.    DIET:  We do recommend a small meal at first, but then you may proceed to your regular diet.  Drink plenty of fluids but you should avoid alcoholic beverages for 24 hours.  ACTIVITY:  You should plan to take it easy for the rest of today and you should NOT DRIVE or use heavy machinery until tomorrow (because of the sedation medicines used during the test).    FOLLOW UP: Our staff will call the number listed on your records the next business day following your procedure.  We will call around 7:15- 8:00 am to check on you and address any questions or concerns that you may have regarding the information given to you following your procedure. If we do not reach you, we will leave a message.     If any biopsies were taken you will be contacted by phone or by letter within the next 1-3 weeks.  Please call us  at [phone] if you have not heard about the biopsies in 3 weeks.    SIGNATURES/CONFIDENTIALITY: You and/or your care partner have signed paperwork which will be entered into your electronic medical record.  These signatures attest to the fact that that the information above on your After  Visit Summary has been reviewed and is understood.  Full responsibility of the confidentiality of this discharge information lies with you and/or your care-partner.

## 2023-10-21 ENCOUNTER — Telehealth: Payer: Self-pay | Admitting: *Deleted

## 2023-10-21 NOTE — Telephone Encounter (Signed)
 Post procedure follow up call placed, no answer and left VM.

## 2023-10-22 ENCOUNTER — Encounter: Payer: Self-pay | Admitting: Internal Medicine

## 2023-10-22 ENCOUNTER — Ambulatory Visit: Payer: Self-pay | Admitting: Internal Medicine

## 2023-10-22 DIAGNOSIS — Z860100 Personal history of colon polyps, unspecified: Secondary | ICD-10-CM | POA: Insufficient documentation

## 2023-10-22 HISTORY — DX: Personal history of colon polyps, unspecified: Z86.0100

## 2023-10-22 LAB — SURGICAL PATHOLOGY

## 2024-06-14 ENCOUNTER — Other Ambulatory Visit: Payer: Self-pay | Admitting: Obstetrics and Gynecology

## 2024-06-14 DIAGNOSIS — Z1231 Encounter for screening mammogram for malignant neoplasm of breast: Secondary | ICD-10-CM

## 2024-07-09 ENCOUNTER — Ambulatory Visit
Admission: RE | Admit: 2024-07-09 | Discharge: 2024-07-09 | Disposition: A | Source: Ambulatory Visit | Attending: Obstetrics and Gynecology | Admitting: Obstetrics and Gynecology

## 2024-07-09 DIAGNOSIS — Z1231 Encounter for screening mammogram for malignant neoplasm of breast: Secondary | ICD-10-CM
